# Patient Record
Sex: Female | Born: 1980
Health system: Southern US, Community
[De-identification: ages and names within clinical notes are randomized; demographics above are authoritative.]

## PROBLEM LIST (undated history)

## (undated) DIAGNOSIS — R87629 Unspecified abnormal cytological findings in specimens from vagina: Secondary | ICD-10-CM

## (undated) DIAGNOSIS — E0500 Thyrotoxicosis with diffuse goiter without thyrotoxic crisis or storm: Secondary | ICD-10-CM

## (undated) DIAGNOSIS — F419 Anxiety disorder, unspecified: Secondary | ICD-10-CM

## (undated) DIAGNOSIS — I10 Essential (primary) hypertension: Secondary | ICD-10-CM

## (undated) DIAGNOSIS — F329 Major depressive disorder, single episode, unspecified: Secondary | ICD-10-CM

## (undated) DIAGNOSIS — F32A Depression, unspecified: Secondary | ICD-10-CM

## (undated) DIAGNOSIS — E079 Disorder of thyroid, unspecified: Secondary | ICD-10-CM

## (undated) HISTORY — PX: CERVICAL CONE BIOPSY: SUR198

## (undated) HISTORY — PX: GYNECOLOGIC CRYOSURGERY: SHX857

## (undated) HISTORY — DX: Unspecified abnormal cytological findings in specimens from vagina: R87.629

## (undated) HISTORY — DX: Depression, unspecified: F32.A

## (undated) HISTORY — DX: Major depressive disorder, single episode, unspecified: F32.9

## (undated) HISTORY — DX: Anxiety disorder, unspecified: F41.9

## (undated) HISTORY — PX: TUBAL LIGATION: SHX77

---

## 2011-12-04 ENCOUNTER — Emergency Department (HOSPITAL_COMMUNITY)
Admission: EM | Admit: 2011-12-04 | Discharge: 2011-12-04 | Disposition: A | Discharge status: Home / Self Care | Payer: Self-pay | Attending: Emergency Medicine | Admitting: Emergency Medicine

## 2011-12-04 ENCOUNTER — Encounter (HOSPITAL_COMMUNITY): Payer: Self-pay | Admitting: Emergency Medicine

## 2011-12-04 DIAGNOSIS — E079 Disorder of thyroid, unspecified: Secondary | ICD-10-CM | POA: Insufficient documentation

## 2011-12-04 DIAGNOSIS — J029 Acute pharyngitis, unspecified: Secondary | ICD-10-CM | POA: Insufficient documentation

## 2011-12-04 DIAGNOSIS — F17200 Nicotine dependence, unspecified, uncomplicated: Secondary | ICD-10-CM | POA: Insufficient documentation

## 2011-12-04 DIAGNOSIS — I10 Essential (primary) hypertension: Secondary | ICD-10-CM | POA: Insufficient documentation

## 2011-12-04 HISTORY — DX: Essential (primary) hypertension: I10

## 2011-12-04 HISTORY — DX: Disorder of thyroid, unspecified: E07.9

## 2011-12-04 LAB — RAPID STREP SCREEN (MED CTR MEBANE ONLY): Streptococcus, Group A Screen (Direct): NEGATIVE

## 2011-12-04 MED ORDER — HYDROCODONE-ACETAMINOPHEN 5-325 MG PO TABS: 2.0000 | ORAL_TABLET | ORAL | Status: DC | PRN

## 2011-12-04 MED ORDER — ACETAMINOPHEN 325 MG PO TABS
650.0000 mg | ORAL_TABLET | Freq: Once | ORAL | Status: AC
  Administered 2011-12-04: 650 mg via ORAL
  Filled 2011-12-04: qty 2

## 2011-12-04 NOTE — ED Provider Notes (Signed)
History     CSN: [id number]  Arrival date & time 12/04/11  2725   First MD Initiated Contact with Patient 12/04/11 [phone]      Chief Complaint  Patient presents with  . Sore Throat     HPI Patient presents with sore throat since early this morning.  No known fever.  No exposure to strep that she is aware of.  No cough.  No vomiting.  Did have a tickle in her throat prior to this.  Past history of Graves' disease. Past Medical History  Diagnosis Date  . Thyroid disease   . Hypertension     Past Surgical History  Procedure Date  . Tonsillectomy     Family History  Problem Relation Age of Onset  . Rheum arthritis Mother   . Asthma Mother   . Asthma Father     History  Substance Use Topics  . Smoking status: Current Every Day Smoker -- 5.0 packs/day    Types: Cigarettes  . Smokeless tobacco: Not on file  . Alcohol Use: Yes     occ    OB History    Grav Para Term Preterm Abortions TAB SAB Ect Mult Living                  Review of Systems All other systems reviewed and are negative Allergies  Review of patient's allergies indicates no known allergies.  Home Medications  No current outpatient prescriptions on file.  BP 133/84  Pulse 89  Temp 100.5 F (38.1 C) (Oral)  Resp 18  SpO2 100%  LMP 11/27/2011  Physical Exam  Nursing note and vitals reviewed. Constitutional: She is oriented to person, place, and time. She appears well-developed and well-nourished. No distress.  HENT:  Head: Normocephalic and atraumatic.  Mouth/Throat: Uvula is midline. Posterior oropharyngeal edema present. No oropharyngeal exudate, posterior oropharyngeal erythema or tonsillar abscesses.  Eyes: Pupils are equal, round, and reactive to light.  Neck: Normal range of motion.  Cardiovascular: Normal rate and intact distal pulses.   Pulmonary/Chest: No respiratory distress.  Abdominal: Normal appearance. She exhibits no distension.  Musculoskeletal: Normal range of motion.    Neurological: She is alert and oriented to person, place, and time. No cranial nerve deficit.  Skin: Skin is warm and dry. No rash noted.  Psychiatric: She has a normal mood and affect. Her behavior is normal.    ED Course  Procedures (including critical care time)  Medications  acetaminophen (TYLENOL) tablet 650 mg (650 mg Oral Given 12/04/11 1144)     Labs Reviewed  RAPID STREP SCREEN   No results found.   1. Pharyngitis       MDM         Nelia Shi, MD 12/04/11 1158

## 2011-12-04 NOTE — ED Notes (Signed)
Pt here for c/o of sore throat and difficulty swallowing.

## 2012-06-03 ENCOUNTER — Encounter (HOSPITAL_COMMUNITY): Payer: Self-pay | Admitting: Emergency Medicine

## 2012-06-03 ENCOUNTER — Emergency Department (INDEPENDENT_AMBULATORY_CARE_PROVIDER_SITE_OTHER): Payer: Self-pay

## 2012-06-03 ENCOUNTER — Other Ambulatory Visit (HOSPITAL_COMMUNITY)
Admission: RE | Admit: 2012-06-03 | Discharge: 2012-06-03 | Disposition: A | Discharge status: Home / Self Care | Payer: Self-pay | Source: Ambulatory Visit | Attending: Emergency Medicine | Admitting: Emergency Medicine

## 2012-06-03 ENCOUNTER — Emergency Department (INDEPENDENT_AMBULATORY_CARE_PROVIDER_SITE_OTHER)
Admission: EM | Admit: 2012-06-03 | Discharge: 2012-06-03 | Disposition: A | Discharge status: Home / Self Care | Payer: Self-pay | Source: Home / Self Care | Attending: Family Medicine | Admitting: Family Medicine

## 2012-06-03 DIAGNOSIS — Z113 Encounter for screening for infections with a predominantly sexual mode of transmission: Secondary | ICD-10-CM | POA: Insufficient documentation

## 2012-06-03 DIAGNOSIS — M546 Pain in thoracic spine: Secondary | ICD-10-CM

## 2012-06-03 DIAGNOSIS — N760 Acute vaginitis: Secondary | ICD-10-CM | POA: Insufficient documentation

## 2012-06-03 DIAGNOSIS — E0590 Thyrotoxicosis, unspecified without thyrotoxic crisis or storm: Secondary | ICD-10-CM

## 2012-06-03 DIAGNOSIS — M549 Dorsalgia, unspecified: Secondary | ICD-10-CM

## 2012-06-03 DIAGNOSIS — F319 Bipolar disorder, unspecified: Secondary | ICD-10-CM

## 2012-06-03 DIAGNOSIS — I10 Essential (primary) hypertension: Secondary | ICD-10-CM

## 2012-06-03 HISTORY — DX: Thyrotoxicosis with diffuse goiter without thyrotoxic crisis or storm: E05.00

## 2012-06-03 LAB — POCT URINALYSIS DIP (DEVICE)
Glucose, UA: NEGATIVE mg/dL
Ketones, ur: NEGATIVE mg/dL
Leukocytes, UA: NEGATIVE
Specific Gravity, Urine: 1.015 (ref 1.005–1.030)

## 2012-06-03 LAB — POCT PREGNANCY, URINE: Preg Test, Ur: NEGATIVE

## 2012-06-03 NOTE — ED Notes (Signed)
Pt c/o pelvic pain onset 1 month Pain radiates towards left side and lower left back Will have occasional pain when she sneezes or coughs Sx also include: vag discharge w/a slight odor Denies: f/v/n/d, dysuria, hematuria  She is alert and oriented w/no signs of acute distress.

## 2012-06-03 NOTE — ED Notes (Signed)
Call back number verified.  

## 2012-06-03 NOTE — ED Provider Notes (Signed)
History     CSN: [id number]  Arrival date & time 06/03/12  1805   First MD Initiated Contact with Patient 06/03/12 2014      Chief Complaint  Patient presents with  . Vaginal Discharge    (Consider location/radiation/quality/duration/timing/severity/associated sxs/prior treatment) HPI Comments: Pt with hx bipolar disorder, hypertension, hyperthyroidism all which are currently untreated as pt recently moved here and doesn't have health insurance. Here for vaginal d/c for a month and L middle lateral back pain on and off for 5 months.   Patient is a 32 y.o. female presenting with vaginal discharge and back pain. The history is provided by the patient.  Vaginal Discharge This is a new problem. Episode onset: 1 month ago. The problem occurs every several days. The problem has not changed since onset.Pertinent negatives include no chest pain and no abdominal pain. Nothing aggravates the symptoms. Nothing relieves the symptoms. She has tried nothing for the symptoms.  Back Pain Location:  Thoracic spine Quality:  Aching Radiates to:  Does not radiate Pain severity:  Moderate Onset quality:  Gradual Duration:  5 months Timing:  Sporadic Progression:  Unchanged Chronicity:  Recurrent Context comment:  Pt can't name anything that precedes pain Relieved by: stretching sometimes helps. Worsened by:  Deep breathing Associated symptoms: no abdominal pain, no chest pain, no dysuria, no fever, no numbness and no tingling     Past Medical History  Diagnosis Date  . Thyroid disease   . Hypertension   . Graves disease     Past Surgical History  Procedure Laterality Date  . Tonsillectomy      Family History  Problem Relation Age of Onset  . Rheum arthritis Mother   . Asthma Mother   . Asthma Father     History  Substance Use Topics  . Smoking status: Current Every Day Smoker -- 5.00 packs/day    Types: Cigarettes  . Smokeless tobacco: Not on file  . Alcohol Use: Yes   Comment: occ    OB History   Grav Para Term Preterm Abortions TAB SAB Ect Mult Living                  Review of Systems  Constitutional: Negative for fever and chills.  Cardiovascular: Negative for chest pain.  Gastrointestinal: Negative for abdominal pain.  Genitourinary: Positive for vaginal discharge. Negative for dysuria and flank pain.  Musculoskeletal: Positive for back pain.  Neurological: Negative for tingling and numbness.    Allergies  Review of patient's allergies indicates no known allergies.  Home Medications   Current Outpatient Rx  Name  Route  Sig  Dispense  Refill  . ARIPiprazole (ABILIFY PO)   Oral   Take by mouth.         Marland Kitchen HYDROcodone-acetaminophen (NORCO/VICODIN) 5-325 MG per tablet   Oral   Take 2 tablets by mouth every 4 (four) hours as needed for pain.   10 tablet   0   . Sertraline HCl (ZOLOFT PO)   Oral   Take by mouth.           BP 151/96  Pulse 65  Temp(Src) 98.6 F (37 C) (Oral)  Resp 18  SpO2 98%  LMP 05/11/2012  Physical Exam  Constitutional: She appears well-developed and well-nourished. No distress.  Neck: Thyromegaly present.  Cardiovascular: Normal rate and regular rhythm.   Pulmonary/Chest: Effort normal and breath sounds normal. She exhibits no tenderness.  Abdominal: Normal appearance and bowel sounds are normal. She exhibits  no distension. There is no hepatosplenomegaly. There is tenderness in the right lower quadrant, suprapubic area and left lower quadrant. There is no rigidity, no rebound, no guarding and no CVA tenderness.  Tenderness to palp is mild  Genitourinary: There is no rash, tenderness or lesion on the right labia. There is no rash, tenderness or lesion on the left labia. Uterus is enlarged. Cervix exhibits no motion tenderness, no discharge and no friability. Right adnexum displays no mass and no tenderness. Left adnexum displays no mass and no tenderness. No vaginal discharge found.  Pt cervix shows  changes from colpo last year.   Musculoskeletal:       Thoracic back: Normal.       Lumbar back: Normal.  Lymphadenopathy:       Right: No inguinal adenopathy present.       Left: No inguinal adenopathy present.  Psychiatric: She has a normal mood and affect. Her speech is normal and behavior is normal. Judgment and thought content normal. Cognition and memory are normal.    ED Course  Procedures (including critical care time)  Labs Reviewed  POCT URINALYSIS DIP (DEVICE)  POCT PREGNANCY, URINE  CERVICOVAGINAL ANCILLARY ONLY   Dg Chest 2 View  06/03/2012  *RADIOLOGY REPORT*  Clinical Data: Left-sided chest pain  CHEST - 2 VIEW  Comparison: None.  Findings: Heart size is normal.  Mediastinal shadows are normal. The lungs are clear.  Vascularity is normal.  No effusions.  No significant bony finding.  IMPRESSION: Normal chest   Original Report Authenticated By: Paulina Fusi, M.D.      1. Mid back pain on left side   2. Hyperthyroidism   3. Hypertension   4. Bipolar disorder       MDM  As pt's back hurts with deep breathing, she is a smoker and pain was not reproducible with palpation, cxr performed which was negative. No vaginal discharge noted on exam, no treatment offered, will wait for results of testing.  Pt's thyroid enlarged, bp elevated- stressed to pt importance getting pcp. Given numbers for adult care clinic and [facility], as well as number for behavioral health.    06/05/12: pt's wet prep positive for gardnerella. E-script for flagyl 500mg  BID #14 sent to pharmacy on file.      Cathlyn Parsons, NP 06/03/12 2024  Cathlyn Parsons, NP 06/05/12 [phone]

## 2012-06-05 ENCOUNTER — Telehealth (HOSPITAL_COMMUNITY): Payer: Self-pay | Admitting: *Deleted

## 2012-06-05 MED ORDER — METRONIDAZOLE 500 MG PO TABS: 500.0000 mg | ORAL_TABLET | Freq: Two times a day (BID) | ORAL | Status: DC

## 2012-06-05 NOTE — ED Notes (Signed)
Gc/Chlamydia neg., Affirm: Candida and Trich neg., Gardnerella pos.  Rica Mast e-prescribed Flagyl to the [facility] at [facility]. I called and left a message to call. Vassie Moselle 06/05/2012

## 2012-06-06 ENCOUNTER — Telehealth (HOSPITAL_COMMUNITY): Payer: Self-pay | Admitting: *Deleted

## 2012-06-06 NOTE — ED Notes (Signed)
I called pt. Pt. verified x 2 and given results.  Pt. told she needs Flagyl for bacterial vaginosis.   Pt. instructed to no alcohol while taking this medication.  Pt. told the Rx. was sent to  [facility].  Pt.'s questions about bacterial vaginosis answered. Pt. voiced understanding. Vassie Moselle 06/06/2012

## 2012-06-11 NOTE — ED Provider Notes (Signed)
Medical screening examination/treatment/procedure(s) were performed by resident physician or non-physician practitioner and as supervising physician I was immediately available for consultation/collaboration.   Barkley Bruns MD.   Linna Hoff, MD 06/11/12 1224

## 2013-01-14 ENCOUNTER — Ambulatory Visit: Payer: Self-pay | Attending: Internal Medicine | Admitting: Internal Medicine

## 2013-01-14 ENCOUNTER — Ambulatory Visit (HOSPITAL_COMMUNITY)
Admission: RE | Admit: 2013-01-14 | Discharge: 2013-01-14 | Disposition: A | Discharge status: Home / Self Care | Payer: No Typology Code available for payment source | Source: Ambulatory Visit | Attending: Internal Medicine | Admitting: Internal Medicine

## 2013-01-14 VITALS — BP 114/77 | HR 88 | Temp 99.1°F | Resp 16 | Ht 63.0 in | Wt 152.0 lb

## 2013-01-14 DIAGNOSIS — IMO0002 Reserved for concepts with insufficient information to code with codable children: Secondary | ICD-10-CM | POA: Insufficient documentation

## 2013-01-14 DIAGNOSIS — M25569 Pain in unspecified knee: Secondary | ICD-10-CM | POA: Insufficient documentation

## 2013-01-14 DIAGNOSIS — M25561 Pain in right knee: Secondary | ICD-10-CM | POA: Insufficient documentation

## 2013-01-14 DIAGNOSIS — M1710 Unilateral primary osteoarthritis, unspecified knee: Secondary | ICD-10-CM | POA: Insufficient documentation

## 2013-01-14 MED ORDER — ACETAMINOPHEN-CODEINE #3 300-30 MG PO TABS: 1.0000 | ORAL_TABLET | ORAL | Status: DC | PRN

## 2013-01-14 NOTE — Addendum Note (Signed)
Addended by: Alison Murray on: 01/14/2013 03:53 PM   Modules accepted: Orders

## 2013-01-14 NOTE — Progress Notes (Addendum)
Patient ID: Yolanda Estrada, female   DOB: 07-13-1980, 32 y.o.   MRN: [id number]  CC: Knee pain  HPI: 32 year old female with past medical history of Graves' disease and bipolar disorder but is currently not on any medications presented to clinic for evaluation of bilateral knee pain, on and off for last 2 years she has been having knee pain ever since she fell on her knees. She never had evaluation at that time. There is no swelling or erythema at the joints. She is able to ambulate on her own. Pain is dull and usually 5/10 in intensity and does not go away with Aleve.  No Known Allergies Past Medical History  Diagnosis Date  . Thyroid disease   . Hypertension   . Graves disease    Current Outpatient Prescriptions on File Prior to Visit  Medication Sig Dispense Refill  . ARIPiprazole (ABILIFY PO) Take by mouth.      Marland Kitchen HYDROcodone-acetaminophen (NORCO/VICODIN) 5-325 MG per tablet Take 2 tablets by mouth every 4 (four) hours as needed for pain.  10 tablet  0  . metroNIDAZOLE (FLAGYL) 500 MG tablet Take 1 tablet (500 mg total) by mouth 2 (two) times daily.  14 tablet  0  . Sertraline HCl (ZOLOFT PO) Take by mouth.       No current facility-administered medications on file prior to visit.   Family History  Problem Relation Age of Onset  . Rheum arthritis Mother   . Asthma Mother   . Asthma Father    History   Social History  . Marital Status: Single    Spouse Name: N/A    Number of Children: N/A  . Years of Education: N/A   Occupational History  . Not on file.   Social History Main Topics  . Smoking status: Current Every Day Smoker -- 5.00 packs/day    Types: Cigarettes  . Smokeless tobacco: Not on file  . Alcohol Use: Yes     Comment: occ  . Drug Use: No  . Sexual Activity: Yes   Other Topics Concern  . Not on file   Social History Narrative  . No narrative on file    Review of Systems  Constitutional: Negative for fever, chills, diaphoresis, activity change,  appetite change and fatigue.  HENT: Negative for ear pain, nosebleeds, congestion, facial swelling, rhinorrhea, neck pain, neck stiffness and ear discharge.   Eyes: Negative for pain, discharge, redness, itching and visual disturbance.  Respiratory: Negative for cough, choking, chest tightness, shortness of breath, wheezing and stridor.   Cardiovascular: Negative for chest pain, palpitations and leg swelling.  Gastrointestinal: Negative for abdominal distention.  Genitourinary: Negative for dysuria, urgency, frequency, hematuria, flank pain, decreased urine volume, difficulty urinating and dyspareunia.  Musculoskeletal: Negative for back pain, positive for bilateral knee pain  Neurological: Negative for dizziness, tremors, seizures, syncope, facial asymmetry, speech difficulty, weakness, light-headedness, numbness and headaches.  Hematological: Negative for adenopathy. Does not bruise/bleed easily.  Psychiatric/Behavioral: Negative for hallucinations, behavioral problems, confusion, dysphoric mood, decreased concentration and agitation.    Objective:   Filed Vitals:   01/14/13 1523  BP: 114/77  Pulse: 88  Temp: 99.1 F (37.3 C)  Resp: 16    Physical Exam  Constitutional: Appears well-developed and well-nourished. No distress.  HENT: Normocephalic. External right and left ear normal. Oropharynx is clear and moist.  Eyes: Conjunctivae and EOM are normal. PERRLA, no scleral icterus.  Neck: Normal ROM. Neck supple. No JVD. No tracheal deviation. No thyromegaly.  CVS: RRR,  S1/S2 +, no murmurs, no gallops, no carotid bruit.  Pulmonary: Effort and breath sounds normal, no stridor, rhonchi, wheezes, rales.  Abdominal: Soft. BS +,  no distension, tenderness, rebound or guarding.  Musculoskeletal: Normal range of motion. No edema and no tenderness.  Lymphadenopathy: No lymphadenopathy noted, cervical, inguinal. Neuro: Alert. Normal reflexes, muscle tone coordination. No cranial nerve  deficit. Skin: Skin is warm and dry. No rash noted. Not diaphoretic. No erythema. No pallor.  Psychiatric: Normal mood and affect. Behavior, judgment, thought content normal.   No results found for this basename: WBC, HGB, HCT, MCV, PLT   No results found for this basename: CREATININE, BUN, NA, K, CL, CO2    No results found for this basename: HGBA1C   Lipid Panel  No results found for this basename: chol, trig, hdl, cholhdl, vldl, ldlcalc       Assessment and plan:   Patient Active Problem List   Diagnosis Date Noted  . Bilateral knee pain 01/14/2013    Priority: High - obtain x rays of the both knees     H/O Graves disease - not on any meds - will check TSH today

## 2013-01-14 NOTE — Progress Notes (Signed)
Pt is here to establish care. Pt reports having pain in her joint and mostly in her knees.

## 2013-01-14 NOTE — Patient Instructions (Signed)
Knee Pain Knee pain can be a result of an injury or other medical conditions. Treatment will depend on the cause of your pain. HOME CARE  Only take medicine as told by your doctor.  Keep a healthy weight. Being overweight can make the knee hurt more.  Stretch before exercising or playing sports.  If there is constant knee pain, change the way you exercise. Ask your doctor for advice.  Make sure shoes fit well. Choose the right shoe for the sport or activity.  Protect your knees. Wear kneepads if needed.  Rest when you are tired. GET HELP RIGHT AWAY IF:   Your knee pain does not stop.  Your knee pain does not get better.  Your knee joint feels hot to the touch.  You have a fever. MAKE SURE YOU:   Understand these instructions.  Will watch this condition.  Will get help right away if you are not doing well or get worse. Document Released: 04/21/2008 Document Revised: 04/17/2011 Document Reviewed: 04/21/2008 [employer] Patient Information 2014 [employer].  

## 2013-01-15 LAB — TSH: TSH: 0.486 u[IU]/mL (ref 0.350–4.500)

## 2013-01-16 ENCOUNTER — Telehealth: Payer: Self-pay | Admitting: Emergency Medicine

## 2013-01-16 ENCOUNTER — Telehealth: Payer: Self-pay | Admitting: Internal Medicine

## 2013-01-16 DIAGNOSIS — E0500 Thyrotoxicosis with diffuse goiter without thyrotoxic crisis or storm: Secondary | ICD-10-CM

## 2013-01-16 NOTE — Telephone Encounter (Signed)
Pt calling about lab and x-ray results.  Please f/u with pt.

## 2013-01-16 NOTE — Telephone Encounter (Signed)
Pt requesting lab imaging results/labs

## 2013-02-11 ENCOUNTER — Ambulatory Visit: Payer: Self-pay

## 2013-02-21 NOTE — Telephone Encounter (Signed)
Please inform patient that her knee x-rays shows osteoarthritis, no fracture or dislocation seen

## 2013-02-21 NOTE — Telephone Encounter (Signed)
Please inform patient that her thyroid function is normal.

## 2013-02-24 NOTE — Addendum Note (Signed)
Addended by: Nonnie DoneSMITH, Camron Essman D on: 02/24/2013 05:52 PM   Modules accepted: Orders

## 2013-02-24 NOTE — Telephone Encounter (Signed)
Spoke with pt regarding knee pain and lab results. Pt requesting referral for knee pain and endocrinology for graves disease. Referral placed. Pt aware

## 2013-03-02 ENCOUNTER — Emergency Department (HOSPITAL_COMMUNITY): Payer: No Typology Code available for payment source

## 2013-03-02 ENCOUNTER — Encounter (HOSPITAL_COMMUNITY): Payer: Self-pay | Admitting: Emergency Medicine

## 2013-03-02 ENCOUNTER — Emergency Department (HOSPITAL_COMMUNITY)
Admission: EM | Admit: 2013-03-02 | Discharge: 2013-03-02 | Disposition: A | Discharge status: Home / Self Care | Payer: No Typology Code available for payment source | Attending: Emergency Medicine | Admitting: Emergency Medicine

## 2013-03-02 DIAGNOSIS — F411 Generalized anxiety disorder: Secondary | ICD-10-CM | POA: Insufficient documentation

## 2013-03-02 DIAGNOSIS — R002 Palpitations: Secondary | ICD-10-CM | POA: Insufficient documentation

## 2013-03-02 DIAGNOSIS — E079 Disorder of thyroid, unspecified: Secondary | ICD-10-CM | POA: Insufficient documentation

## 2013-03-02 DIAGNOSIS — F419 Anxiety disorder, unspecified: Secondary | ICD-10-CM

## 2013-03-02 DIAGNOSIS — R42 Dizziness and giddiness: Secondary | ICD-10-CM

## 2013-03-02 DIAGNOSIS — E0500 Thyrotoxicosis with diffuse goiter without thyrotoxic crisis or storm: Secondary | ICD-10-CM | POA: Insufficient documentation

## 2013-03-02 DIAGNOSIS — I69998 Other sequelae following unspecified cerebrovascular disease: Secondary | ICD-10-CM | POA: Insufficient documentation

## 2013-03-02 DIAGNOSIS — R51 Headache: Secondary | ICD-10-CM | POA: Insufficient documentation

## 2013-03-02 DIAGNOSIS — F17200 Nicotine dependence, unspecified, uncomplicated: Secondary | ICD-10-CM | POA: Insufficient documentation

## 2013-03-02 DIAGNOSIS — F1220 Cannabis dependence, uncomplicated: Secondary | ICD-10-CM | POA: Insufficient documentation

## 2013-03-02 LAB — URINALYSIS, ROUTINE W REFLEX MICROSCOPIC
Bilirubin Urine: NEGATIVE
Glucose, UA: NEGATIVE mg/dL
HGB URINE DIPSTICK: NEGATIVE
Ketones, ur: NEGATIVE mg/dL
LEUKOCYTES UA: NEGATIVE
NITRITE: NEGATIVE
PROTEIN: NEGATIVE mg/dL
SPECIFIC GRAVITY, URINE: 1.021 (ref 1.005–1.030)
UROBILINOGEN UA: 0.2 mg/dL (ref 0.0–1.0)
pH: 7 (ref 5.0–8.0)

## 2013-03-02 LAB — CBC WITH DIFFERENTIAL/PLATELET
BASOS ABS: 0 10*3/uL (ref 0.0–0.1)
Basophils Relative: 0 % (ref 0–1)
EOS ABS: 0.1 10*3/uL (ref 0.0–0.7)
EOS PCT: 1 % (ref 0–5)
HEMATOCRIT: 39.5 % (ref 36.0–46.0)
Hemoglobin: 13.3 g/dL (ref 12.0–15.0)
LYMPHS PCT: 51 % — AB (ref 12–46)
Lymphs Abs: 3.6 10*3/uL (ref 0.7–4.0)
MCH: 27.7 pg (ref 26.0–34.0)
MCHC: 33.7 g/dL (ref 30.0–36.0)
MCV: 82.1 fL (ref 78.0–100.0)
MONO ABS: 0.3 10*3/uL (ref 0.1–1.0)
Monocytes Relative: 5 % (ref 3–12)
Neutro Abs: 3.1 10*3/uL (ref 1.7–7.7)
Neutrophils Relative %: 43 % (ref 43–77)
Platelets: 251 10*3/uL (ref 150–400)
RBC: 4.81 MIL/uL (ref 3.87–5.11)
RDW: 13.6 % (ref 11.5–15.5)
WBC: 7.1 10*3/uL (ref 4.0–10.5)

## 2013-03-02 LAB — POCT I-STAT, CHEM 8
BUN: 8 mg/dL (ref 6–23)
CALCIUM ION: 1.24 mmol/L — AB (ref 1.12–1.23)
Chloride: 103 mEq/L (ref 96–112)
Creatinine, Ser: 1 mg/dL (ref 0.50–1.10)
Glucose, Bld: 81 mg/dL (ref 70–99)
HCT: 44 % (ref 36.0–46.0)
HEMOGLOBIN: 15 g/dL (ref 12.0–15.0)
Potassium: 4.1 mEq/L (ref 3.7–5.3)
SODIUM: 140 meq/L (ref 137–147)
TCO2: 25 mmol/L (ref 0–100)

## 2013-03-02 LAB — POCT PREGNANCY, URINE: PREG TEST UR: NEGATIVE

## 2013-03-02 MED ORDER — LORAZEPAM 1 MG PO TABS: 1.0000 mg | ORAL_TABLET | Freq: Three times a day (TID) | ORAL | Status: DC | PRN

## 2013-03-02 MED ORDER — MECLIZINE HCL 25 MG PO TABS
25.0000 mg | ORAL_TABLET | Freq: Once | ORAL | Status: AC
  Administered 2013-03-02: 25 mg via ORAL
  Filled 2013-03-02: qty 1

## 2013-03-02 NOTE — ED Notes (Signed)
Pt here with c/o anxiety , pt states that her pulses was around 113 at school , pt HR is 84 today , pt in in school and working 2 jobs

## 2013-03-02 NOTE — ED Notes (Signed)
Pt ambulated to restroom. 

## 2013-03-02 NOTE — ED Notes (Signed)
Patient transported to CT 

## 2013-03-02 NOTE — ED Provider Notes (Addendum)
CSN: [id number]     Arrival date & time 03/02/13  1249 History   First MD Initiated Contact with Patient 03/02/13 1339     Chief Complaint  Patient presents with  . Anxiety   (Consider location/radiation/quality/duration/timing/severity/associated sxs/prior Treatment) Patient is a 33 y.o. female presenting with dizziness. The history is provided by the patient.  Dizziness Quality:  Imbalance and room spinning Severity:  Moderate Onset quality:  Gradual Timing:  Intermittent Progression:  Waxing and waning Chronicity:  New Context: bending over, head movement and standing up   Context: not with loss of consciousness   Relieved by:  Closing eyes and lying down Worsened by:  Movement, turning head and standing up (walking) Associated symptoms: headaches and palpitations   Associated symptoms: no nausea, no shortness of breath, no vision changes, no vomiting and no weakness   Risk factors: no anemia   Risk factors comment:  Hx of graves disease but recent check by PCP and normal thyroid   Past Medical History  Diagnosis Date  . Thyroid disease   . Hypertension   . Graves disease    Past Surgical History  Procedure Laterality Date  . Tubal ligation     Family History  Problem Relation Age of Onset  . Rheum arthritis Mother   . Asthma Mother   . Asthma Father    History  Substance Use Topics  . Smoking status: Current Every Day Smoker -- 5.00 packs/day    Types: Cigarettes  . Smokeless tobacco: Not on file  . Alcohol Use: Yes     Comment: occ   OB History   Grav Para Term Preterm Abortions TAB SAB Ect Mult Living                 Review of Systems  Respiratory: Negative for shortness of breath.   Cardiovascular: Positive for palpitations.  Gastrointestinal: Negative for nausea and vomiting.  Neurological: Positive for dizziness and headaches.  Psychiatric/Behavioral: Negative for suicidal ideas, confusion, sleep disturbance and self-injury. The patient is not  nervous/anxious.   All other systems reviewed and are negative.    Allergies  Review of patient's allergies indicates no known allergies.  Home Medications   Current Outpatient Rx  Name  Route  Sig  Dispense  Refill  . naproxen sodium (ANAPROX) 220 MG tablet   Oral   Take 220 mg by mouth as needed (for osteoarthritis).          BP 122/78  Pulse 88  Temp(Src) 98.9 F (37.2 C) (Oral)  Resp 20  Ht 5\' 2"  (1.575 m)  Wt 148 lb (67.132 kg)  BMI 27.06 kg/m2  SpO2 100% Physical Exam  Nursing note and vitals reviewed. Constitutional: She is oriented to person, place, and time. She appears well-developed and well-nourished. No distress.  HENT:  Head: Normocephalic and atraumatic.  Mouth/Throat: Oropharynx is clear and moist.  Eyes: Conjunctivae and EOM are normal. Pupils are equal, round, and reactive to light.  No nystagmus  Neck: Normal range of motion. Neck supple.  Cardiovascular: Normal rate, regular rhythm and intact distal pulses.   No murmur heard. Pulmonary/Chest: Effort normal and breath sounds normal. No respiratory distress. She has no wheezes. She has no rales.  Abdominal: Soft. She exhibits no distension. There is no tenderness. There is no rebound and no guarding.  Musculoskeletal: Normal range of motion. She exhibits no edema and no tenderness.  Neurological: She is alert and oriented to person, place, and time. She has normal strength.  No cranial nerve deficit or sensory deficit.  Skin: Skin is warm and dry. No rash noted. No erythema.  Psychiatric: She has a normal mood and affect. Her behavior is normal.    ED Course  Procedures (including critical care time) Labs Review Labs Reviewed  CBC WITH DIFFERENTIAL - Abnormal; Notable for the following:    Lymphocytes Relative 51 (*)    All other components within normal limits  POCT I-STAT, CHEM 8 - Abnormal; Notable for the following:    Calcium, Ion 1.24 (*)    All other components within normal limits   URINALYSIS, ROUTINE W REFLEX MICROSCOPIC  POCT PREGNANCY, URINE   Imaging Review Dg Chest 2 View  03/02/2013   CLINICAL DATA:  Palpitation  EXAM: CHEST  2 VIEW  COMPARISON:  06/03/2012  FINDINGS: Normal heart size. Clear lungs. No pleural effusion. No pneumothorax.  IMPRESSION: No active cardiopulmonary disease.   Electronically Signed   By: Maryclare Bean M.D.   On: 03/02/2013 15:26   Ct Head Wo Contrast  03/02/2013   CLINICAL DATA:  Vertigo.  EXAM: CT HEAD WITHOUT CONTRAST  TECHNIQUE: Contiguous axial images were obtained from the base of the skull through the vertex without intravenous contrast.  COMPARISON:  None.  FINDINGS: No evidence of intracranial hemorrhage, brain edema, or other signs of acute infarction. No evidence of intracranial mass lesion or mass effect. No abnormal extraaxial fluid collections identified. Ventricles are normal in size. No skull abnormality identified.  IMPRESSION: Negative noncontrast head CT.   Electronically Signed   By: Myles Rosenthal M.D.   On: 03/02/2013 15:03    EKG Interpretation   None      Date: 03/02/2013  Rate: 84  Rhythm: normal sinus rhythm  QRS Axis: normal  Intervals: normal  ST/T Wave abnormalities: normal  Conduction Disutrbances: none  Narrative Interpretation: unremarkable      MDM   1. Anxiety   2. Vertigo     Patient with a prior history of psychiatric disorder and anxiety which she has been able to cope with for the last several years and feels like she's done a good job without medication who presents today with symptoms more suggestive of syncope with an off-balance type feeling and spinning sensation when she changes position or moves her head. Also she gets intermittent palpitations with this. Patient does have a history of Graves' disease but states her TSH was recently checked and was within normal limits. Patient states her periods have been irregular but is getting off of her menses currently. She denies any abdominal pain  he takes no medications. She does use marijuana and smokes cigarettes but denies heavy alcohol use. She denies feeling anxious or suicidal at this time. She is well appearing on exam with normal blood pressure pulse and oxygen saturation and does not appear anxious. Feel most likely symptoms are related to vertigo so we'll treat with meclizine. However will ensure normal hemoglobin levels and that patient is not pregnant.  Pt is PERC neg.  4:05 PM All labs wnl.  Imaging neg.  No changed with meclizine and feel this is most likely pt's anxiety.  Will treat with ativan prn and given information to f/u with [facility].  Gwyneth Sprout, MD 03/02/13 1621  Gwyneth Sprout, MD 03/02/13 1623

## 2013-03-02 NOTE — ED Notes (Signed)
Pt returned from radiology.

## 2013-03-02 NOTE — Discharge Instructions (Signed)
Dizziness ° Dizziness means you feel unsteady or lightheaded. You might feel like you are going to pass out (faint). °HOME CARE  °· Drink enough fluids to keep your pee (urine) clear or pale yellow. °· Take your medicines exactly as told by your doctor. If you take blood pressure medicine, always stand up slowly from the lying or sitting position. Hold on to something to steady yourself. °· If you need to stand in one place for a long time, move your legs often. Tighten and relax your leg muscles. °· Have someone stay with you until you feel okay. °· Do not drive or use heavy machinery if you feel dizzy. °· Do not drink alcohol. °GET HELP RIGHT AWAY IF:  °· You feel dizzy or lightheaded and it gets worse. °· You feel sick to your stomach (nauseous), or you throw up (vomit). °· You have trouble talking or walking. °· You feel weak or have trouble using your arms, hands, or legs. °· You cannot think clearly or have trouble forming sentences. °· You have chest pain, belly (abdominal) pain, sweating, or you are short of breath. °· Your vision changes. °· You are bleeding. °· You have problems from your medicine that seem to be getting worse. °MAKE SURE YOU:  °· Understand these instructions. °· Will watch your condition. °· Will get help right away if you are not doing well or get worse. °Document Released: 01/12/2011 Document Revised: 04/17/2011 Document Reviewed: 01/12/2011 °[employer]® Patient Information ©2014 [employer]. ° °

## 2013-03-03 ENCOUNTER — Ambulatory Visit: Payer: Self-pay | Admitting: Endocrinology

## 2013-03-03 ENCOUNTER — Ambulatory Visit: Payer: No Typology Code available for payment source | Admitting: Internal Medicine

## 2013-03-12 ENCOUNTER — Ambulatory Visit: Payer: Self-pay | Admitting: Family Medicine

## 2013-03-31 ENCOUNTER — Encounter: Payer: Self-pay | Admitting: Sports Medicine

## 2013-03-31 ENCOUNTER — Ambulatory Visit (INDEPENDENT_AMBULATORY_CARE_PROVIDER_SITE_OTHER): Payer: No Typology Code available for payment source | Admitting: Sports Medicine

## 2013-03-31 VITALS — BP 115/73 | HR 78 | Ht 62.0 in | Wt 148.0 lb

## 2013-03-31 DIAGNOSIS — M25561 Pain in right knee: Secondary | ICD-10-CM

## 2013-03-31 DIAGNOSIS — M25562 Pain in left knee: Principal | ICD-10-CM

## 2013-03-31 DIAGNOSIS — M25569 Pain in unspecified knee: Secondary | ICD-10-CM

## 2013-03-31 MED ORDER — MELOXICAM 15 MG PO TABS: 15.0000 mg | ORAL_TABLET | Freq: Every day | ORAL | Status: DC

## 2013-03-31 NOTE — Patient Instructions (Signed)
Thank you for coming today. 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  Please start formal physical therapy to strengthen you quads Please start the meloxicam to relieve some of your pain and decrease inflammation Do not take aleve during this time. Please come back in 4 weeks

## 2013-03-31 NOTE — Assessment & Plan Note (Signed)
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

## 2013-03-31 NOTE — Progress Notes (Signed)
Yolanda Estrada is a 33 y.o. female who [facility] today for bilat knee pain   Knee pain: fell 67mo ago and injured knees Bilat (hit kneecaps directly). Pain is burning. Pain is in knees w/o radiation. When standing feels like knees are unstable. L>R. Changed gait due to work. Denies swelling but feels like fluid in knees. Pain is constant. Worse w/ standing and at night. Keeps up at night. Wakes up from sleep. 7 Aleve per day and 4-5 tylenol per day from time to time w/o benefit.  Ran track in HS and jobs have always required her to be on her feet. Pizza delivery for a living. Associated w/ knees popping and occasionally locking.   PMH reviewed.  ROS as above otherwise neg Medications reviewed.  Exam:  BP 115/73  Pulse 78  Ht 5\' 2"  (1.575 m)  Wt 148 lb (67.132 kg)  BMI 27.06 kg/m2  LMP 02/27/2013 Gen: Well NAD MSK: FROM bilat. L knee medial/lateral joint space ttp. No effusion. Crepitus on L. Strength w/ flexion and extension 5/5. Pattella rocking painful bilat. Apprehension + bilat. Valgus and varrus stresses w/o pain. Lachman's Neg.   PT A LITTLE HISTRIONIC DURING EXAM  Dg Chest 2 View  03/02/2013   CLINICAL DATA:  Palpitation  EXAM: CHEST  2 VIEW  COMPARISON:  06/03/2012  FINDINGS: Normal heart size. Clear lungs. No pleural effusion. No pneumothorax.  IMPRESSION: No active cardiopulmonary disease.   Electronically Signed   By: Maryclare BeanArt  Hoss M.D.   On: 03/02/2013 15:26   Ct Head Wo Contrast  03/02/2013   CLINICAL DATA:  Vertigo.  EXAM: CT HEAD WITHOUT CONTRAST  TECHNIQUE: Contiguous axial images were obtained from the base of the skull through the vertex without intravenous contrast.  COMPARISON:  None.  FINDINGS: No evidence of intracranial hemorrhage, brain edema, or other signs of acute infarction. No evidence of intracranial mass lesion or mass effect. No abnormal extraaxial fluid collections identified. Ventricles are normal in size. No skull abnormality identified.  IMPRESSION:  Negative noncontrast head CT.   Electronically Signed   By: Myles RosenthalJohn  Stahl M.D.   On: 03/02/2013 15:03   Xrays reviewed and w/o overt arthritis.   Assessment and Plan: 1) Bilat knee pain likely from patella femoral syndrome w/ patella instability likely from quad atrophy - Formal PT - Meloxicam - return in 4 wks and consider injections if no improvement  Shelly Flattenavid Eudora Guevarra, MD Family Medicine PGY-3 03/31/2013, 10:00 AM

## 2013-04-07 ENCOUNTER — Ambulatory Visit: Payer: No Typology Code available for payment source

## 2013-04-14 ENCOUNTER — Ambulatory Visit: Payer: No Typology Code available for payment source | Admitting: Physical Therapy

## 2013-08-21 ENCOUNTER — Ambulatory Visit: Payer: No Typology Code available for payment source

## 2014-01-14 ENCOUNTER — Encounter (HOSPITAL_COMMUNITY): Payer: Self-pay | Admitting: Emergency Medicine

## 2014-01-14 ENCOUNTER — Emergency Department (HOSPITAL_COMMUNITY)
Admission: EM | Admit: 2014-01-14 | Discharge: 2014-01-14 | Disposition: A | Discharge status: Home / Self Care | Payer: Medicaid Other | Source: Home / Self Care | Attending: Emergency Medicine | Admitting: Emergency Medicine

## 2014-01-14 DIAGNOSIS — M545 Low back pain, unspecified: Secondary | ICD-10-CM

## 2014-01-14 LAB — POCT URINALYSIS DIP (DEVICE)
BILIRUBIN URINE: NEGATIVE
GLUCOSE, UA: NEGATIVE mg/dL
Ketones, ur: NEGATIVE mg/dL
LEUKOCYTES UA: NEGATIVE
NITRITE: NEGATIVE
PH: 6 (ref 5.0–8.0)
Protein, ur: NEGATIVE mg/dL
Specific Gravity, Urine: >=1.03 (ref 1.005–1.030)
UROBILINOGEN UA: 0.2 mg/dL (ref 0.0–1.0)

## 2014-01-14 LAB — POCT PREGNANCY, URINE: Preg Test, Ur: NEGATIVE

## 2014-01-14 MED ORDER — KETOROLAC TROMETHAMINE 60 MG/2ML IM SOLN
INTRAMUSCULAR | Status: AC
Start: 2014-01-14 — End: 2014-01-14
  Filled 2014-01-14: qty 2

## 2014-01-14 MED ORDER — MELOXICAM 15 MG PO TABS: 15.0000 mg | ORAL_TABLET | Freq: Every day | ORAL | Status: DC

## 2014-01-14 MED ORDER — METHYLPREDNISOLONE ACETATE 80 MG/ML IJ SUSP
80.0000 mg | Freq: Once | INTRAMUSCULAR | Status: AC
  Administered 2014-01-14: 80 mg via INTRAMUSCULAR

## 2014-01-14 MED ORDER — PREDNISONE 20 MG PO TABS: ORAL_TABLET | ORAL | Status: DC

## 2014-01-14 MED ORDER — KETOROLAC TROMETHAMINE 60 MG/2ML IM SOLN
60.0000 mg | Freq: Once | INTRAMUSCULAR | Status: AC
  Administered 2014-01-14: 60 mg via INTRAMUSCULAR

## 2014-01-14 MED ORDER — HYDROCODONE-ACETAMINOPHEN 5-325 MG PO TABS: ORAL_TABLET | ORAL | Status: DC

## 2014-01-14 MED ORDER — METHOCARBAMOL 500 MG PO TABS: 500.0000 mg | ORAL_TABLET | Freq: Three times a day (TID) | ORAL | Status: DC

## 2014-01-14 MED ORDER — METHYLPREDNISOLONE ACETATE 80 MG/ML IJ SUSP
INTRAMUSCULAR | Status: AC
  Filled 2014-01-14: qty 1

## 2014-01-14 NOTE — ED Provider Notes (Signed)
Chief Complaint   Back Pain   History of Present Illness   Yolanda Estrada is a 33 year old female, mother of 3 teenage and preteen age girls, who has had a three-month history of aching in her lower back has been intermittent. This been worse the past 2 weeks. She denies any injury. The pain is confined to the back and does not radiate down into the legs. There is no numbness, tingling, or weakness in the legs. No bladder or bowel dysfunction or saddle anesthesia. She denies any abdominal pain. She's had no fever, chills, headache, weight loss. The pain is worse with bending, lifting, and twisting.  Review of Systems   Other than as noted above, the patient denies any of the following symptoms: Systemic:  No fever, chills, or unexplained weight loss. GI:  No abdominal pain or incontinence of bowel. GU:  No dysuria, frequency, urgency, or hematuria. No incontinence of urine or urinary retention.  M-S:  No neck pain or arthritis. Neuro:  No paresthesias, headache, saddle anesthesia, muscular weakness, or progressive neurological deficit.  PMFSH   Past medical history, family history, social history, meds, and allergies were reviewed. Specifically, there is no history of cancer, major trauma, osteoporosis, immunosuppression, or HIV infection. She has Graves' disease and a history of bipolar disorder.  Physical Examination    Vital signs:  BP 119/76 mmHg  Pulse 66  Temp(Src) 97.6 F (36.4 C) (Oral)  Resp 16  SpO2 100%  LMP 01/14/2014 General:  Alert, oriented, in no distress. Abdomen:  Soft, non-tender.  No organomegaly or mass.  No pulsatile midline abdominal mass or bruit. Back:  There is mild tenderness to palpation up and down the entire lumbar spine both in the paravertebral areas at the midline. Her back has 30 of forward flexion, 10 of backward extension, 20 of lateral bending, and 45 of rotation with pain. Straight leg raising produces pain in the lower back but no pain  radiating down the legs. Neuro:  Normal muscle strength, sensations and DTRs. Extremities: Pedal pulses were full, there was no edema. Skin:  Clear, warm and dry.  No rash.  Labs   Results for orders placed or performed during the hospital encounter of 01/14/14  POCT urinalysis dip (device)  Result Value Ref Range   Glucose, UA NEGATIVE NEGATIVE mg/dL   Bilirubin Urine NEGATIVE NEGATIVE   Ketones, ur NEGATIVE NEGATIVE mg/dL   Specific Gravity, Urine >=1.030 1.005 - 1.030   Hgb urine dipstick MODERATE (A) NEGATIVE   pH 6.0 5.0 - 8.0   Protein, ur NEGATIVE NEGATIVE mg/dL   Urobilinogen, UA 0.2 0.0 - 1.0 mg/dL   Nitrite NEGATIVE NEGATIVE   Leukocytes, UA NEGATIVE NEGATIVE  Pregnancy, urine POC  Result Value Ref Range   Preg Test, Ur NEGATIVE NEGATIVE    Course in Urgent Care Center   The following medications were given:  Medications  ketorolac (TORADOL) injection 60 mg (60 mg Intramuscular Given 01/14/14 1916)  methylPREDNISolone acetate (DEPO-MEDROL) injection 80 mg (80 mg Intramuscular Given 01/14/14 1916)   Assessment   The encounter diagnosis was Bilateral low back pain without sciatica.  No evidence of cauda equina syndrome, discitis, epidural abscess, fracture, acute pyelonephritis, bleed, cancer, or aneurism.    Plan     1.  Meds:  The following meds were prescribed:   Discharge Medication List as of 01/14/2014  6:58 PM    START taking these medications   Details  HYDROcodone-acetaminophen (NORCO/VICODIN) 5-325 MG per tablet 1 to 2 tabs  every 4 to 6 hours as needed for pain., Print    methocarbamol (ROBAXIN) 500 MG tablet Take 1 tablet (500 mg total) by mouth 3 (three) times daily., Starting 01/14/2014, Until Discontinued, Normal    predniSONE (DELTASONE) 20 MG tablet Take 3 daily for 5 days, 2 daily for 5 days, 1 daily for 5 days., Normal        2.  Patient Education/Counseling:  The patient was given appropriate handouts, self care instructions, and instructed  in symptomatic relief. The patient was encouraged to try to be as active as possible and given some exercises to do followed by moist heat.  3.  Follow up:  The patient was told to follow up here if no better in 3 to 4 days, or sooner if becoming worse in any way, and given some red flag symptoms such as worsening pain or new neurological symptoms which would prompt immediate return.  Follow up with Dr. Renaye Rakersim Murphy within the next week.     Reuben Likesavid C Dorsel Flinn, MD 01/14/14 [phone]

## 2014-01-14 NOTE — Discharge Instructions (Signed)
Do exercises twice daily followed by moist heat for 15 minutes. ° ° ° ° ° °Try to be as active as possible. ° °If no better in 2 weeks, follow up with orthopedist. ° ° °

## 2014-01-14 NOTE — ED Notes (Signed)
C/o intermittent lower back pain onset 3 months; last 2 weeks have been worse Pain increases w/activity Denies inj/trauma, urinary sx, gyn sx Alert, no signs of acute distress.

## 2015-01-29 IMAGING — CR DG KNEE COMPLETE 4+V*L*
4 series · 4 of 4 positions shown · non-contrast
Comparison: None.

CLINICAL DATA: Bilateral knee pain.

EXAM:
LEFT KNEE - COMPLETE 4+ VIEW

[t knee ap left]
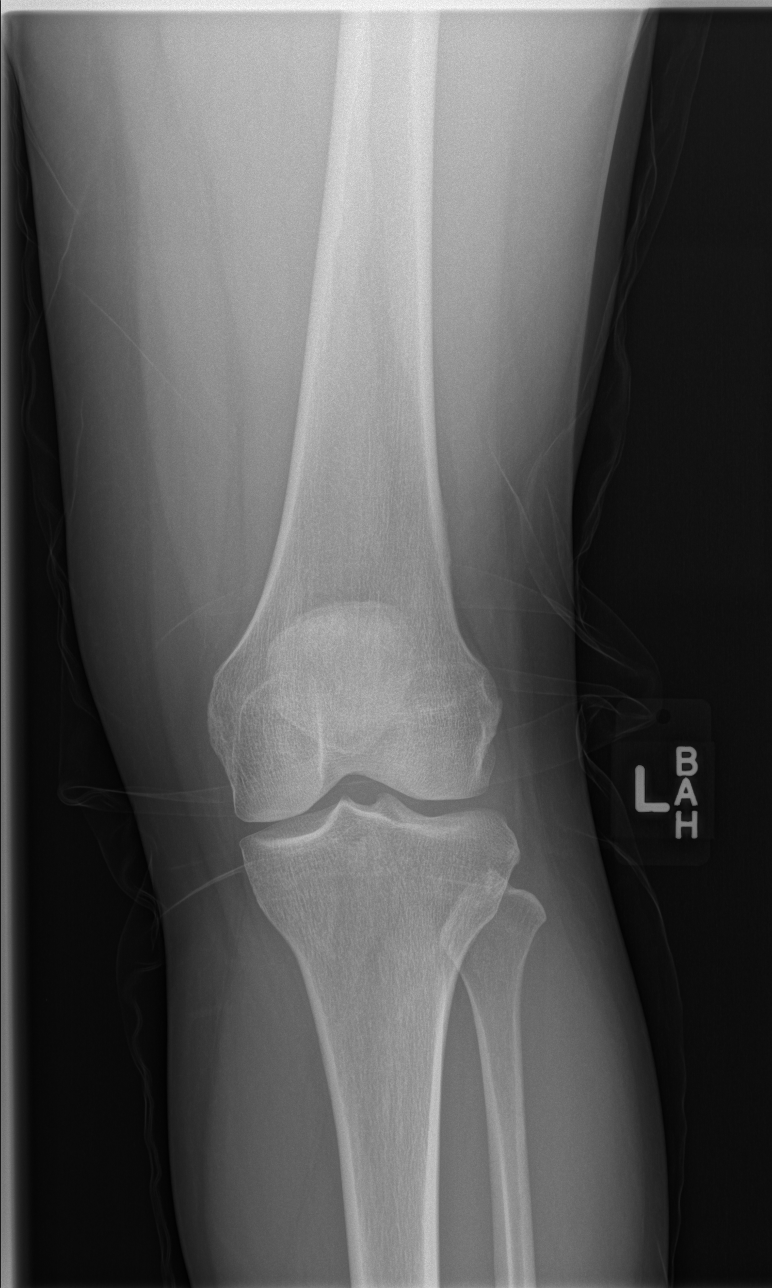

[t knee obl left (1 of 2)]
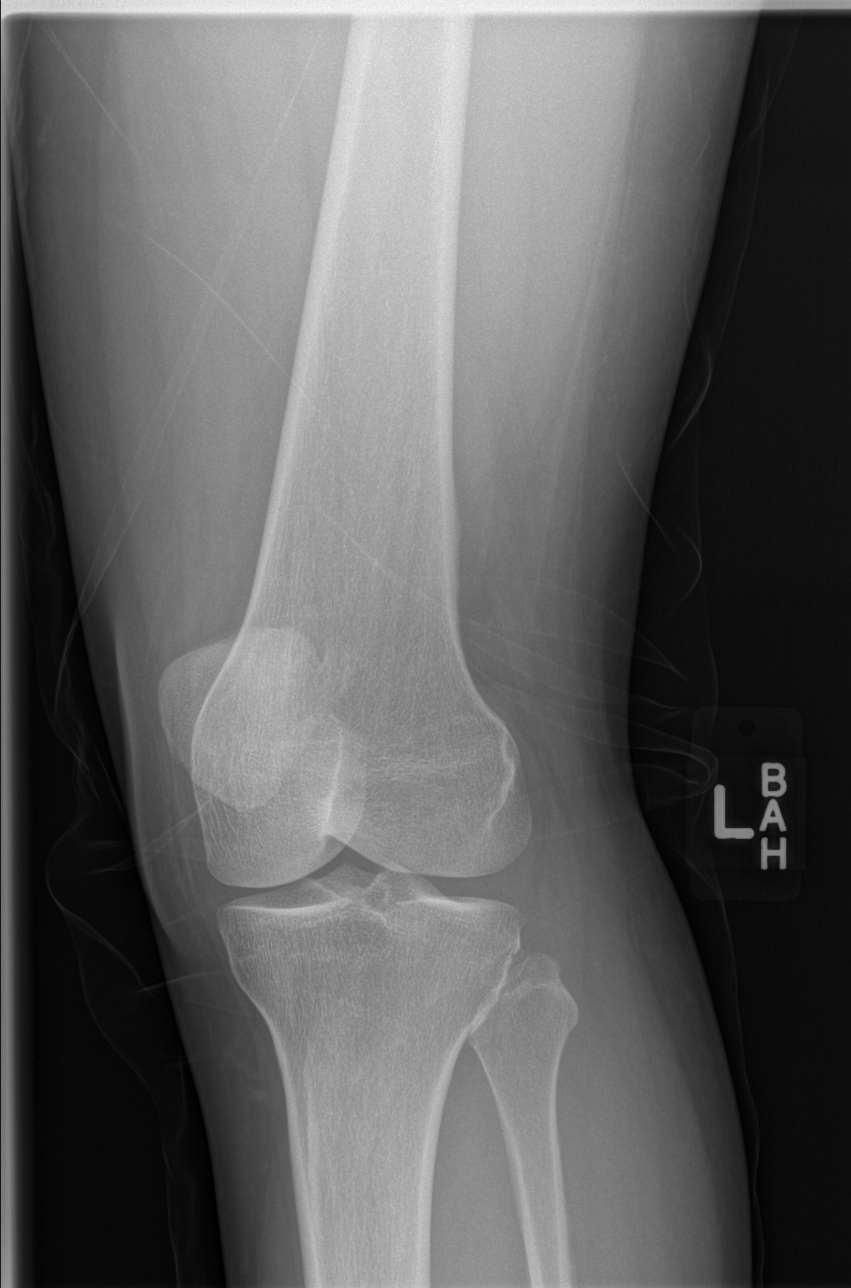

[t knee obl left (2 of 2)]
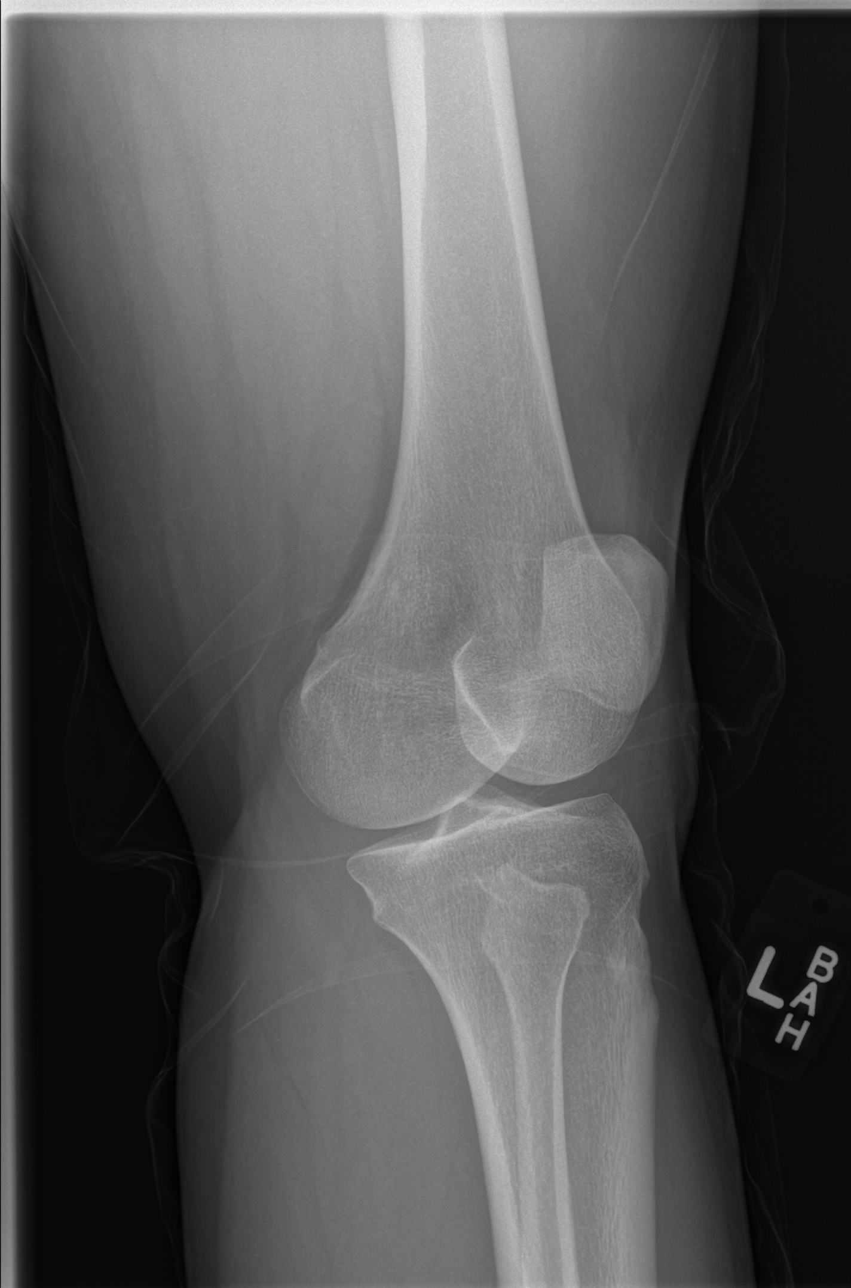

[t knee lat left]
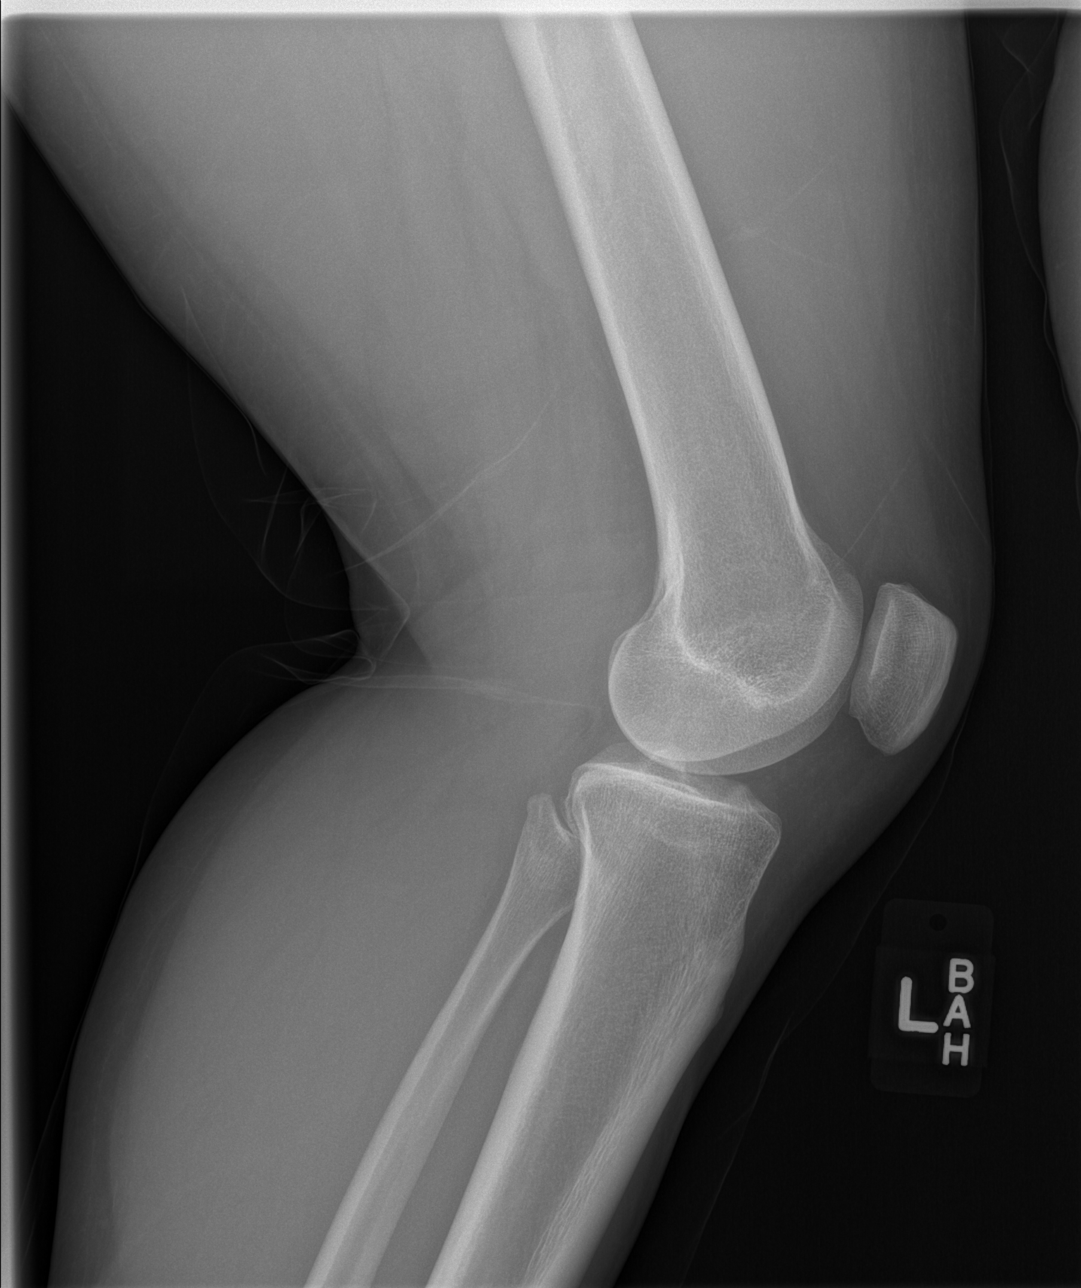

[4 of 4 positions shown; findings below may reference images not displayed]

FINDINGS: Anatomic alignment of the left knee. Medial and lateral compartment
joint space is preserved. Tiny marginal osteophytes off the patella
compatible with very mild patellar osteoarthritis. No effusion. No
fracture. No destructive osseous lesion.
IMPRESSION: Mild patellofemoral osteoarthritis.

## 2015-01-29 IMAGING — CR DG KNEE COMPLETE 4+V*R*
4 series · 4 of 4 positions shown · non-contrast
Comparison: None.

CLINICAL DATA: Bilateral knee pain for 1 year.

EXAM:
RIGHT KNEE - COMPLETE 4+ VIEW

[t knee ap right]
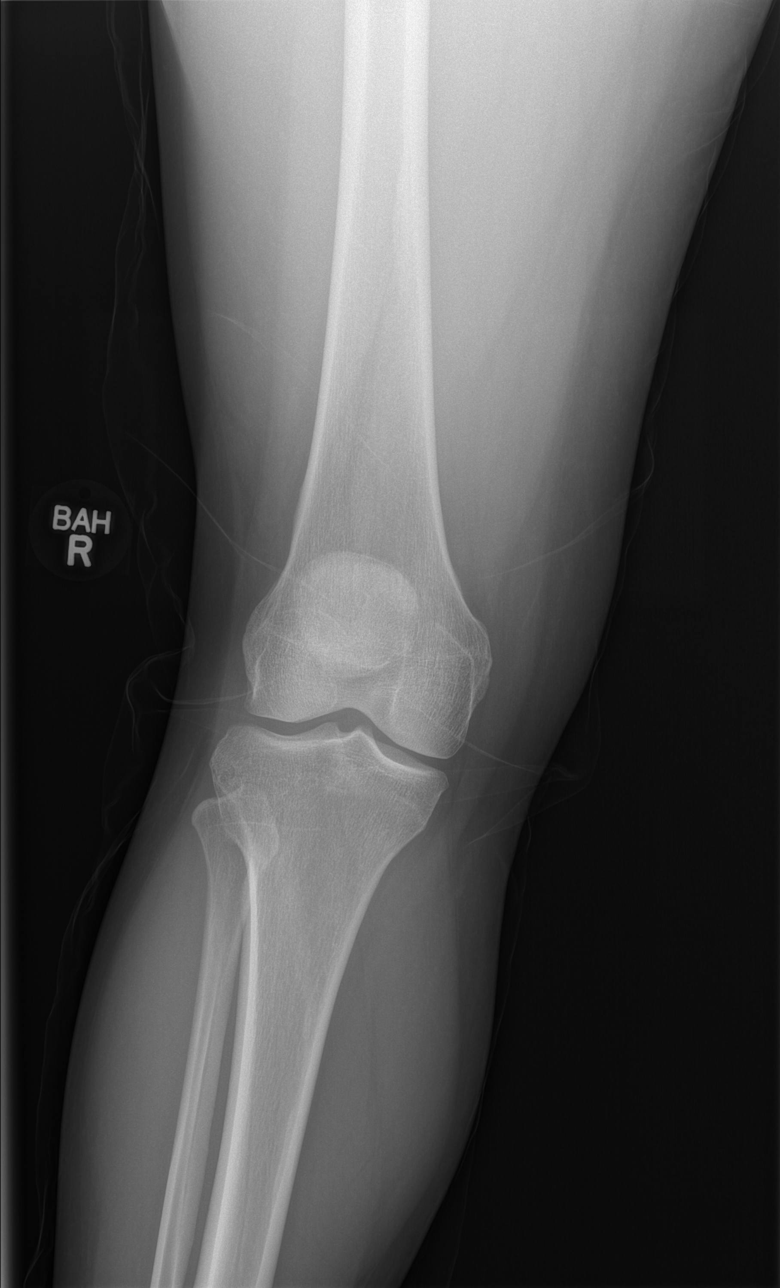

[t knee obl right (1 of 2)]
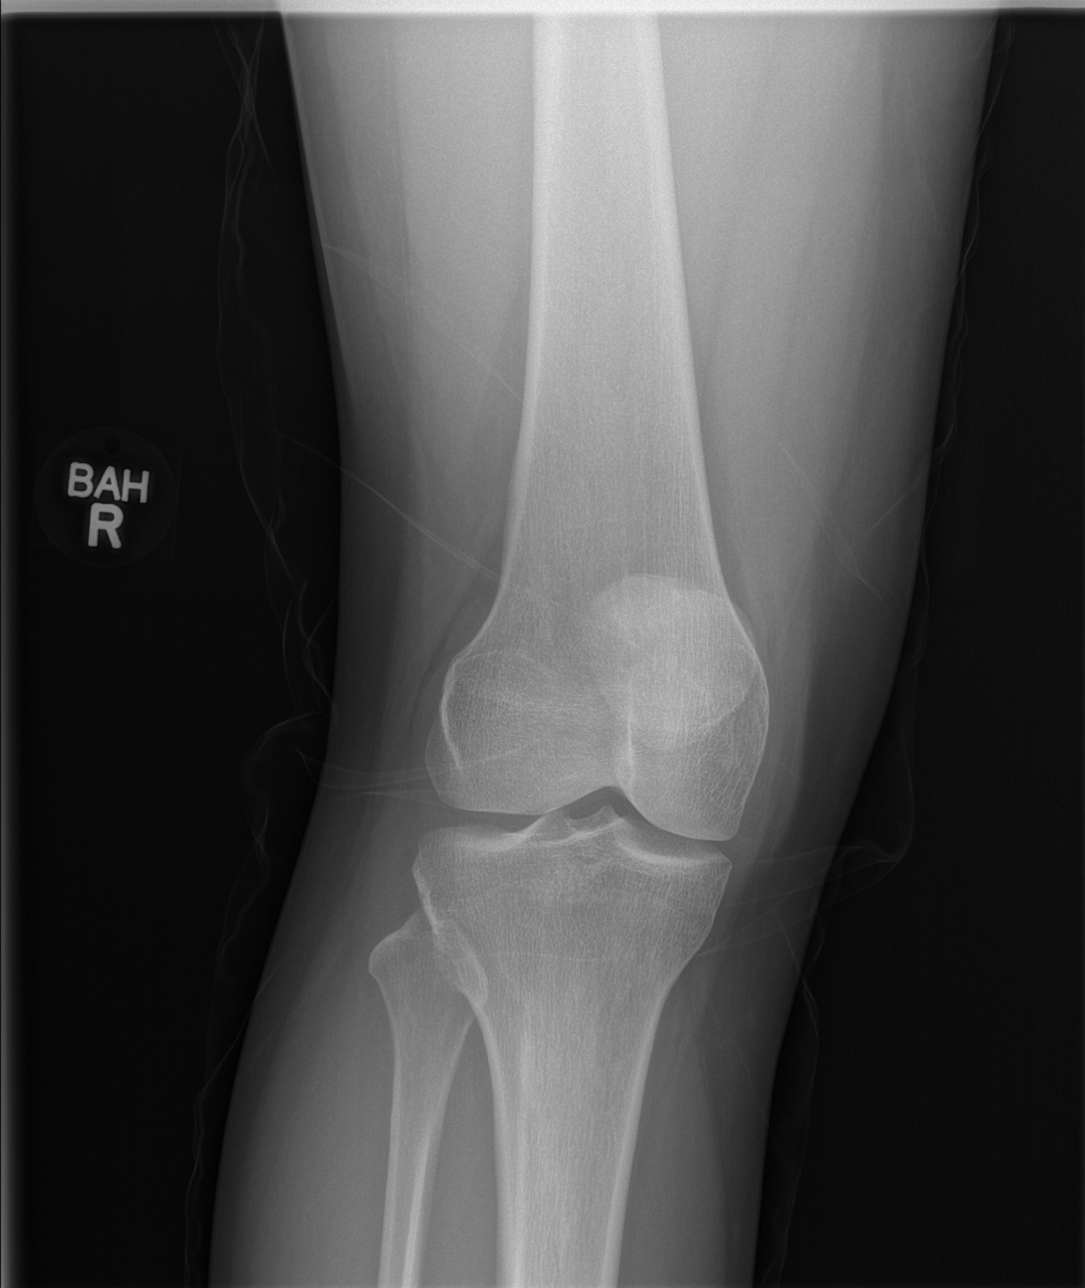

[t knee obl right (2 of 2)]
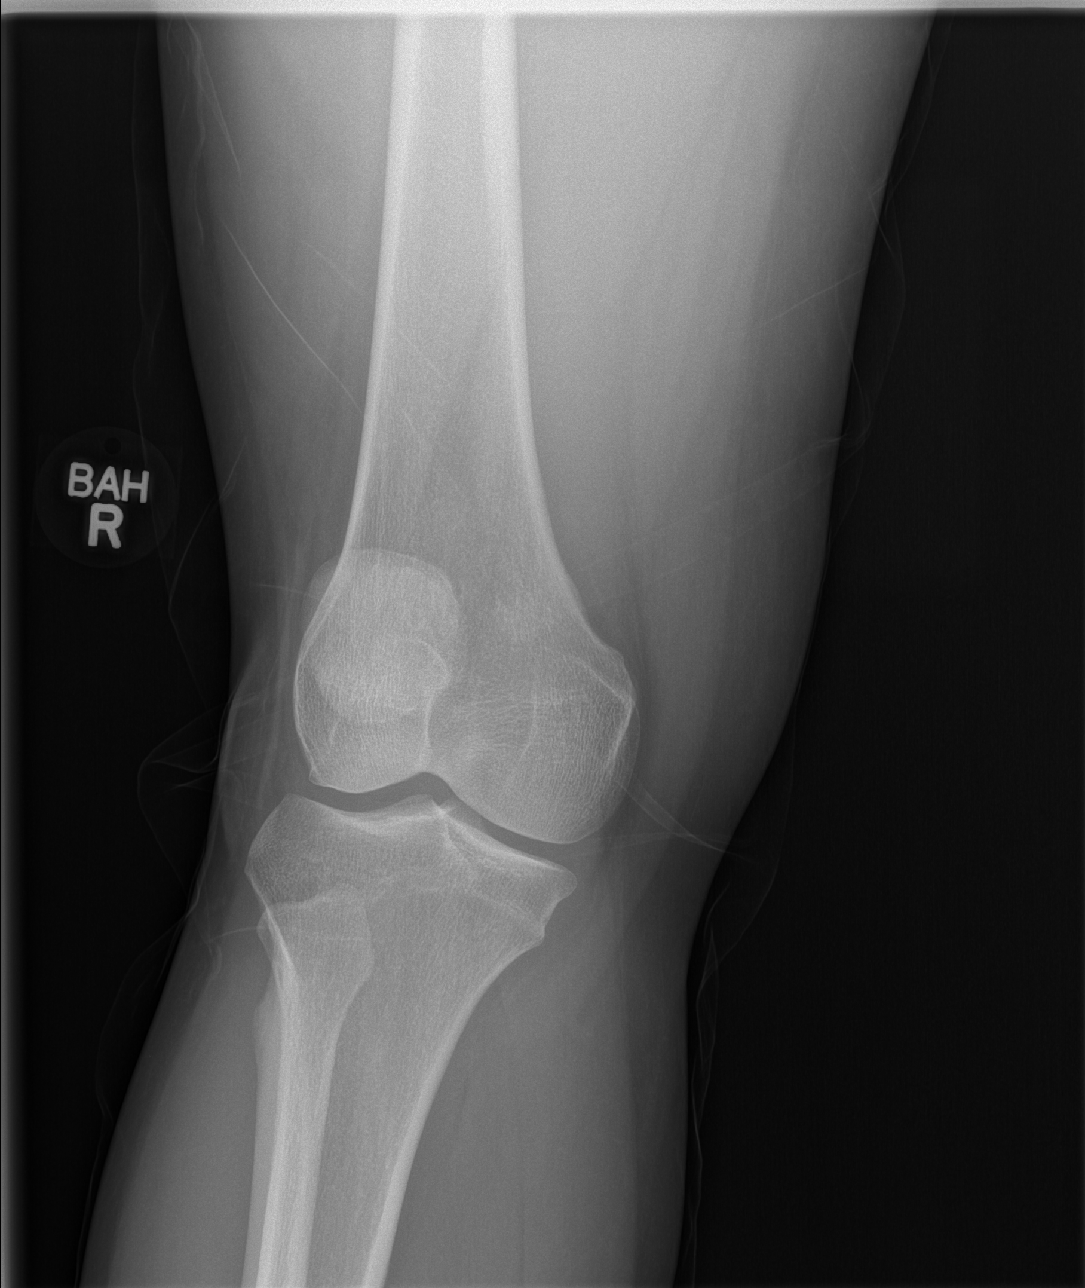

[t knee lat right]
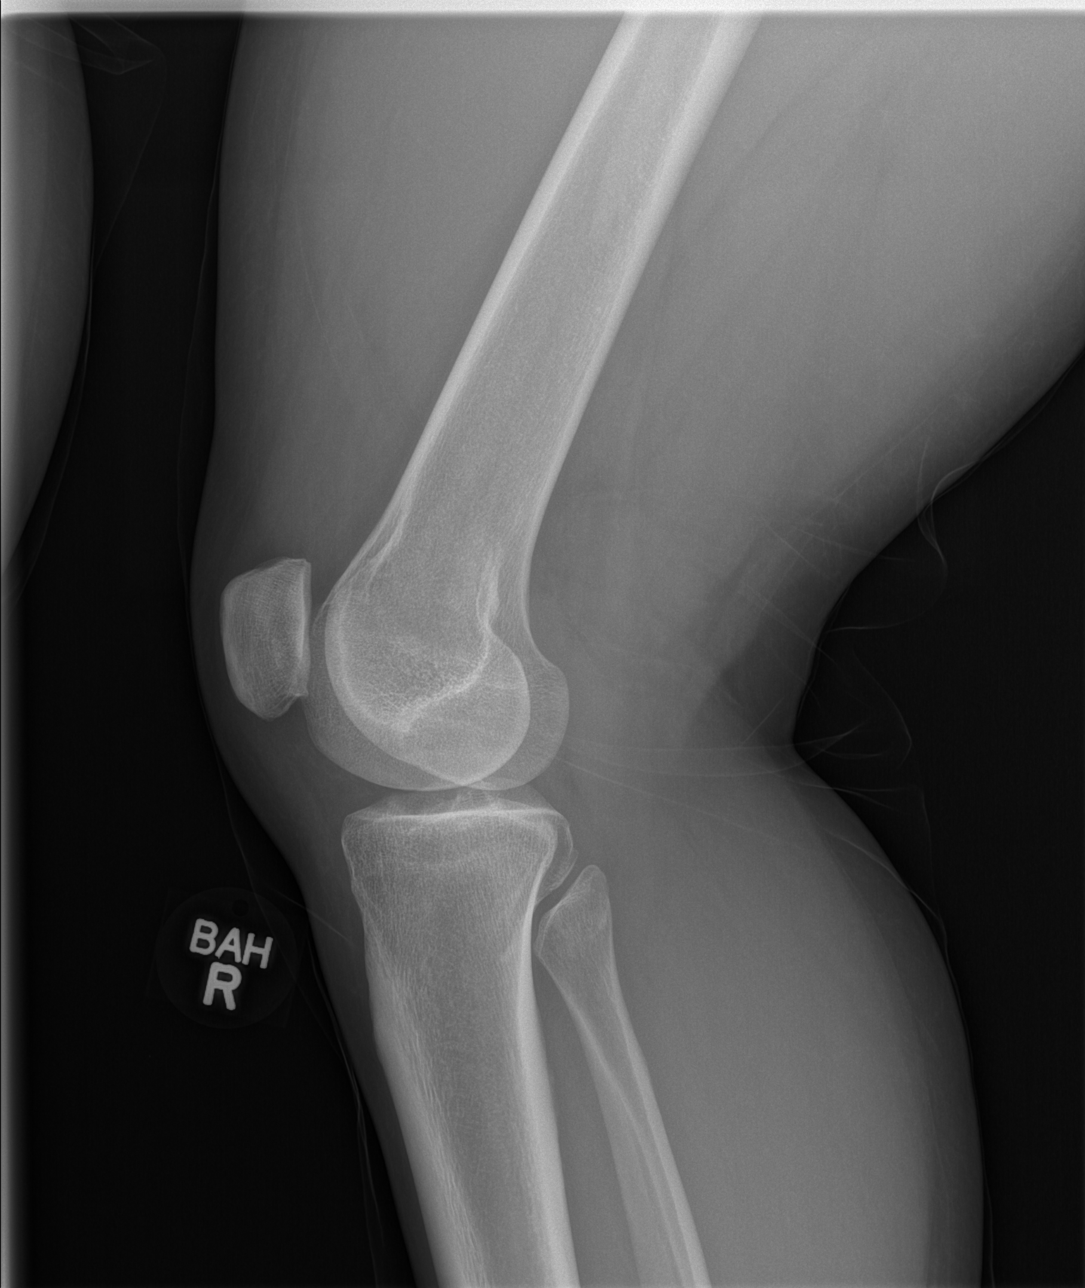

[4 of 4 positions shown; findings below may reference images not displayed]

FINDINGS: Anatomic alignment of the right knee. There is no fracture. Medial
and lateral joint spaces are preserved. There is no effusion. Mild
patellofemoral osteoarthritis is present.
IMPRESSION: Mild patellofemoral osteoarthritis.

## 2017-03-06 ENCOUNTER — Other Ambulatory Visit: Payer: Self-pay

## 2017-03-06 ENCOUNTER — Encounter (HOSPITAL_COMMUNITY): Payer: Self-pay | Admitting: Emergency Medicine

## 2017-03-06 ENCOUNTER — Observation Stay (HOSPITAL_COMMUNITY)
Admission: EM | Admit: 2017-03-06 | Discharge: 2017-03-07 | Disposition: A | Discharge status: Home / Self Care | Payer: 59 | Attending: Family Medicine | Admitting: Family Medicine

## 2017-03-06 DIAGNOSIS — Z79899 Other long term (current) drug therapy: Secondary | ICD-10-CM | POA: Insufficient documentation

## 2017-03-06 DIAGNOSIS — Z87898 Personal history of other specified conditions: Secondary | ICD-10-CM | POA: Diagnosis not present

## 2017-03-06 DIAGNOSIS — F17210 Nicotine dependence, cigarettes, uncomplicated: Secondary | ICD-10-CM | POA: Diagnosis not present

## 2017-03-06 DIAGNOSIS — M542 Cervicalgia: Secondary | ICD-10-CM | POA: Diagnosis not present

## 2017-03-06 DIAGNOSIS — R202 Paresthesia of skin: Secondary | ICD-10-CM | POA: Diagnosis not present

## 2017-03-06 DIAGNOSIS — H538 Other visual disturbances: Secondary | ICD-10-CM | POA: Insufficient documentation

## 2017-03-06 DIAGNOSIS — I10 Essential (primary) hypertension: Secondary | ICD-10-CM | POA: Diagnosis not present

## 2017-03-06 DIAGNOSIS — R51 Headache: Secondary | ICD-10-CM | POA: Diagnosis not present

## 2017-03-06 DIAGNOSIS — R200 Anesthesia of skin: Secondary | ICD-10-CM

## 2017-03-06 DIAGNOSIS — R42 Dizziness and giddiness: Secondary | ICD-10-CM | POA: Diagnosis not present

## 2017-03-06 DIAGNOSIS — Z8639 Personal history of other endocrine, nutritional and metabolic disease: Secondary | ICD-10-CM

## 2017-03-06 NOTE — ED Triage Notes (Signed)
Pt states she was arguing with her kids on Thursday and had a sharp pain like a vice grip at the back of her neck and base of her skull  Pt states since then she has had dizziness and blurred vision in her right eye   Pt states the pain at the base of her skull is like a dull ache since

## 2017-03-06 NOTE — ED Provider Notes (Signed)
TIME SEEN: 11:47 PM  CHIEF COMPLAINT: Severe headache, vertigo, right eye blurry vision  HPI: Patient is a 37 year old female with history of hypertension, Graves' disease presents to the emergency department with complaints of pressure-like headache in the posterior aspect of her head, vertiginous symptoms and right eye blurry vision.  States that she was arguing with her daughters on Thursday, January 24 when she suddenly felt like there was a "vice grip" of the base of her head.  Has never had similar headaches before.  States afterwards she developed vertigo and felt like the room was spinning.  Has also had intermittent right eye blurry vision but no double vision or vision loss.  She denies any numbness or focal weakness but does state she has some tingling in the fingertips of her right hand that has been ongoing since January 25.  No head injury.  Not on antiplatelets or anticoagulants.  Does not wear glasses or contacts.  No ear pain, hearing loss or tinnitus.  No nausea or vomiting.  No diarrhea.  No fever.  ROS: See HPI Constitutional: no fever  Eyes: no drainage  ENT: no runny nose   Cardiovascular:  no chest pain  Resp: no SOB  GI: no vomiting GU: no dysuria Integumentary: no rash  Allergy: no hives  Musculoskeletal: no leg swelling  Neurological: no slurred speech ROS otherwise negative  PAST MEDICAL HISTORY/PAST SURGICAL HISTORY:  Past Medical History:  Diagnosis Date  . Graves disease   . Hypertension   . Thyroid disease     MEDICATIONS:  Prior to Admission medications   Medication Sig Start Date End Date Taking? Authorizing Provider  HYDROcodone-acetaminophen (NORCO/VICODIN) 5-325 MG per tablet 1 to 2 tabs every 4 to 6 hours as needed for pain. 01/14/14   Reuben LikesKeller, David C, MD  LORazepam (ATIVAN) 1 MG tablet Take 1 tablet (1 mg total) by mouth 3 (three) times daily as needed for anxiety. 03/02/13   Gwyneth SproutPlunkett, Whitney, MD  meloxicam (MOBIC) 15 MG tablet Take 1 tablet (15  mg total) by mouth daily. 01/14/14   Reuben LikesKeller, David C, MD  methocarbamol (ROBAXIN) 500 MG tablet Take 1 tablet (500 mg total) by mouth 3 (three) times daily. 01/14/14   Reuben LikesKeller, David C, MD  naproxen sodium (ANAPROX) 220 MG tablet Take 220 mg by mouth as needed (for osteoarthritis).    [provider]  predniSONE (DELTASONE) 20 MG tablet Take 3 daily for 5 days, 2 daily for 5 days, 1 daily for 5 days. 01/14/14   Reuben LikesKeller, David C, MD    ALLERGIES:  No Known Allergies  SOCIAL HISTORY:  Social History   Tobacco Use  . Smoking status: Current Every Day Smoker    Packs/day: 5.00    Types: Cigarettes  . Smokeless tobacco: Never Used  Substance Use Topics  . Alcohol use: Yes    Comment: occ    FAMILY HISTORY: Family History  Problem Relation Age of Onset  . Rheum arthritis Mother   . Asthma Mother   . Asthma Father     EXAM: BP 129/86 (BP Location: Left Arm)   Pulse 73   Temp 98.1 F (36.7 C) (Oral)   Resp 18   Ht 5\' 2"  (1.575 m)   Wt 70.3 kg (155 lb)   LMP 02/11/2017 (Exact Date)   SpO2 98%   BMI 28.35 kg/m  CONSTITUTIONAL: Alert and oriented and responds appropriately to questions. Well-appearing; well-nourished HEAD: Normocephalic EYES: Conjunctivae clear, pupils appear equal, EOMI ENT: normal nose;  moist mucous membranes NECK: Supple, no meningismus, no nuchal rigidity, no LAD; unable to reproduce pain with palpation of the neck, no redness, warmth, ecchymosis noted.  No carotid bruit appreciated.  No pharyngeal erythema or petechiae, no tonsillar hypertrophy or exudate, no uvular deviation, no unilateral swelling, no trismus or drooling, no muffled voice, normal phonation, no stridor, no dental caries present, no drainable dental abscess noted, no Ludwig's angina, tongue sits flat in the bottom of the mouth, no angioedema, no facial erythema or warmth, no facial swelling; no pain with movement of the neck.  TMs are clear bilaterally without erythema, purulence, bulging,  perforation, effusion.  No cerumen impaction or sign of foreign body in the external auditory canal. No inflammation, erythema or drainage from the external auditory canal. No signs of mastoiditis. No pain with manipulation of the pinna bilaterally. CARD: RRR; S1 and S2 appreciated; no murmurs, no clicks, no rubs, no gallops RESP: Normal chest excursion without splinting or tachypnea; breath sounds clear and equal bilaterally; no wheezes, no rhonchi, no rales, no hypoxia or respiratory distress, speaking full sentences ABD/GI: Normal bowel sounds; non-distended; soft, non-tender, no rebound, no guarding, no peritoneal signs, no hepatosplenomegaly BACK:  The back appears normal and is non-tender to palpation, there is no CVA tenderness EXT: Normal ROM in all joints; non-tender to palpation; no edema; normal capillary refill; no cyanosis, no calf tenderness or swelling    SKIN: Normal color for age and race; warm; no rash NEURO: Moves all extremities equally, strength 5/5 in all 4 extremities, cranial nerves II through XII intact, reports diminished sensation in her left face compared to the right but otherwise sensation to light touch intact diffusely, no dysmetria to finger to nose testing bilaterally, patient does have fatigable horizontal nystagmus when looking to the left which reproduces her vertiginous symptoms PSYCH: The patient's mood and manner are appropriate. Grooming and personal hygiene are appropriate.  MEDICAL DECISION MAKING: Patient here with concerning symptoms of sudden onset headache, right eye blurry vision and vertigo.  States started after arguing with her daughters.  Concern for possible stroke, intracranial hemorrhage, dissection.  Will obtain CTA of her head and neck.  Symptoms started almost a week ago.  Outside of any TPA window.  We will treat symptomatically with IV fluids, Reglan, Benadryl and meclizine.  Will obtain labs.  ED PROGRESS: Labs unremarkable.  CTA of the head  and neck showed no hemorrhage, aneurysm, dissection or stenosis.  She is still having vertigo, left-sided facial numbness, right eye blurry vision.  Discussed with Dr. Winona Legato with telemetry neurology who has recommended admission to medicine for a stroke workup.  Recommends obtaining an MRI of the brain with and without contrast.  Patient is agreeable to this plan.  She does not have a local primary care physician.   5:25 AM Discussed patient's case with hospitalist, Dr. Toniann Fail.  I have recommended admission and patient (and family if present) agree with this plan. Admitting physician will place admission orders.   I reviewed all nursing notes, vitals, pertinent previous records, EKGs, lab and urine results, imaging (as available).       Ward, Layla Maw, DO 03/07/17 [phone]

## 2017-03-07 ENCOUNTER — Encounter (HOSPITAL_COMMUNITY): Payer: Self-pay

## 2017-03-07 ENCOUNTER — Observation Stay (HOSPITAL_COMMUNITY): Payer: 59

## 2017-03-07 ENCOUNTER — Emergency Department (HOSPITAL_COMMUNITY): Payer: 59

## 2017-03-07 DIAGNOSIS — M542 Cervicalgia: Secondary | ICD-10-CM | POA: Diagnosis not present

## 2017-03-07 DIAGNOSIS — R51 Headache: Secondary | ICD-10-CM | POA: Diagnosis not present

## 2017-03-07 DIAGNOSIS — R42 Dizziness and giddiness: Secondary | ICD-10-CM | POA: Diagnosis not present

## 2017-03-07 DIAGNOSIS — Z8639 Personal history of other endocrine, nutritional and metabolic disease: Secondary | ICD-10-CM | POA: Diagnosis not present

## 2017-03-07 DIAGNOSIS — F17210 Nicotine dependence, cigarettes, uncomplicated: Secondary | ICD-10-CM | POA: Diagnosis not present

## 2017-03-07 DIAGNOSIS — Z79899 Other long term (current) drug therapy: Secondary | ICD-10-CM | POA: Diagnosis not present

## 2017-03-07 DIAGNOSIS — H538 Other visual disturbances: Secondary | ICD-10-CM | POA: Diagnosis not present

## 2017-03-07 DIAGNOSIS — Z87898 Personal history of other specified conditions: Secondary | ICD-10-CM | POA: Diagnosis not present

## 2017-03-07 DIAGNOSIS — I10 Essential (primary) hypertension: Secondary | ICD-10-CM | POA: Diagnosis not present

## 2017-03-07 LAB — CBC WITH DIFFERENTIAL/PLATELET
BASOS ABS: 0 10*3/uL (ref 0.0–0.1)
BASOS PCT: 0 %
Eosinophils Absolute: 0.1 10*3/uL (ref 0.0–0.7)
Eosinophils Relative: 1 %
HEMATOCRIT: 39.7 % (ref 36.0–46.0)
Hemoglobin: 13.3 g/dL (ref 12.0–15.0)
Lymphocytes Relative: 41 %
Lymphs Abs: 3.8 10*3/uL (ref 0.7–4.0)
MCH: 27.6 pg (ref 26.0–34.0)
MCHC: 33.5 g/dL (ref 30.0–36.0)
MCV: 82.4 fL (ref 78.0–100.0)
Monocytes Absolute: 0.5 10*3/uL (ref 0.1–1.0)
Monocytes Relative: 5 %
NEUTROS ABS: 4.8 10*3/uL (ref 1.7–7.7)
Neutrophils Relative %: 53 %
Platelets: 214 10*3/uL (ref 150–400)
RBC: 4.82 MIL/uL (ref 3.87–5.11)
RDW: 13.6 % (ref 11.5–15.5)
WBC: 9.2 10*3/uL (ref 4.0–10.5)

## 2017-03-07 LAB — BASIC METABOLIC PANEL
Anion gap: 7 (ref 5–15)
BUN: 14 mg/dL (ref 6–20)
CALCIUM: 9.3 mg/dL (ref 8.9–10.3)
CO2: 23 mmol/L (ref 22–32)
CREATININE: 0.87 mg/dL (ref 0.44–1.00)
Chloride: 108 mmol/L (ref 101–111)
GFR calc non Af Amer: >60 mL/min (ref >60)
Glucose, Bld: 90 mg/dL (ref 65–99)
Potassium: 3.4 mmol/L — ABNORMAL LOW (ref 3.5–5.1)
Sodium: 138 mmol/L (ref 135–145)

## 2017-03-07 LAB — PROTIME-INR
INR: 0.98
Prothrombin Time: 12.9 seconds (ref 11.4–15.2)

## 2017-03-07 LAB — TSH: TSH: 2.306 u[IU]/mL (ref 0.350–4.500)

## 2017-03-07 LAB — APTT: aPTT: 32 seconds (ref 24–36)

## 2017-03-07 LAB — I-STAT BETA HCG BLOOD, ED (MC, WL, AP ONLY): I-stat hCG, quantitative: <5 m[IU]/mL (ref <5)

## 2017-03-07 LAB — T4, FREE: FREE T4: 0.93 ng/dL (ref 0.61–1.12)

## 2017-03-07 MED ORDER — DIPHENHYDRAMINE HCL 50 MG/ML IJ SOLN
25.0000 mg | Freq: Once | INTRAMUSCULAR | Status: AC
  Administered 2017-03-07: 25 mg via INTRAVENOUS
  Filled 2017-03-07: qty 1

## 2017-03-07 MED ORDER — MECLIZINE HCL 25 MG PO TABS: 25.0000 mg | ORAL_TABLET | Freq: Three times a day (TID) | ORAL | 0 refills | Status: DC | PRN

## 2017-03-07 MED ORDER — METOCLOPRAMIDE HCL 5 MG/ML IJ SOLN
10.0000 mg | Freq: Once | INTRAMUSCULAR | Status: AC
  Administered 2017-03-07: 10 mg via INTRAVENOUS
  Filled 2017-03-07: qty 2

## 2017-03-07 MED ORDER — IOPAMIDOL (ISOVUE-370) INJECTION 76%
100.0000 mL | Freq: Once | INTRAVENOUS | Status: AC | PRN
  Administered 2017-03-07: 100 mL via INTRAVENOUS

## 2017-03-07 MED ORDER — IOPAMIDOL (ISOVUE-370) INJECTION 76%
INTRAVENOUS | Status: AC
  Administered 2017-03-07: 100 mL via INTRAVENOUS
  Filled 2017-03-07: qty 100

## 2017-03-07 MED ORDER — MELOXICAM 15 MG PO TABS: 15.0000 mg | ORAL_TABLET | Freq: Every day | ORAL | 0 refills | Status: DC

## 2017-03-07 MED ORDER — ACETAMINOPHEN 325 MG PO TABS: 650.0000 mg | ORAL_TABLET | ORAL | Status: DC | PRN

## 2017-03-07 MED ORDER — SODIUM CHLORIDE 0.9 % IV SOLN
INTRAVENOUS | Status: DC
  Administered 2017-03-07: 09:00:00 via INTRAVENOUS

## 2017-03-07 MED ORDER — ACETAMINOPHEN 650 MG RE SUPP: 650.0000 mg | RECTAL | Status: DC | PRN

## 2017-03-07 MED ORDER — POTASSIUM CHLORIDE CRYS ER 20 MEQ PO TBCR
40.0000 meq | EXTENDED_RELEASE_TABLET | Freq: Once | ORAL | Status: AC
  Administered 2017-03-07: 40 meq via ORAL
  Filled 2017-03-07: qty 2

## 2017-03-07 MED ORDER — GADOBENATE DIMEGLUMINE 529 MG/ML IV SOLN
15.0000 mL | Freq: Once | INTRAVENOUS | Status: AC | PRN
  Administered 2017-03-07: 14 mL via INTRAVENOUS

## 2017-03-07 MED ORDER — MECLIZINE HCL 25 MG PO TABS
25.0000 mg | ORAL_TABLET | Freq: Once | ORAL | Status: AC
Start: 2017-03-07 — End: 2017-03-07
  Administered 2017-03-07: 25 mg via ORAL
  Filled 2017-03-07: qty 1

## 2017-03-07 MED ORDER — HYDROXYZINE HCL 25 MG PO TABS: 25.0000 mg | ORAL_TABLET | Freq: Three times a day (TID) | ORAL | 0 refills | Status: DC | PRN

## 2017-03-07 MED ORDER — SODIUM CHLORIDE 0.9 % IV BOLUS (SEPSIS)
1000.0000 mL | Freq: Once | INTRAVENOUS | Status: AC
  Administered 2017-03-07: 1000 mL via INTRAVENOUS

## 2017-03-07 MED ORDER — ACETAMINOPHEN 160 MG/5ML PO SOLN: 650.0000 mg | ORAL | Status: DC | PRN

## 2017-03-07 NOTE — ED Notes (Signed)
Pt in MRI at present time.

## 2017-03-07 NOTE — H&P (Signed)
History and Physical    [name] ZOX:[id number] DOB: [birth year] DOA: 03/06/2017  PCP: Patient, No Pcp Per  Patient coming from: Home.  Chief Complaint: Right eye blurry vision.  HPI: Yolanda Estrada is a 37 y.o. female with history of Graves' disease has not been taking any medications presents to the ER with complaints of having blurry vision with dizziness.  Patient states her symptoms started 5 days ago after she had shouted at her kids.  Her head started feeling full and pressurized with her right eye getting blurred off and on.  Patient also at the same time started feeling dizzy on walking.  Symptoms persisted for last 5 days and came to the ER.  Denies any weakness of the upper or lower extremities or any nausea vomiting or difficulty speaking or swallowing.  ED Course: In the ER patient appears nonfocal.  Difficulty on walking due to dizziness.  CT angiogram of the head and neck was negative.  Tele neurology was consulted by the ER physician and at this time neurologist recommended to get MRI brain with and without contrast.  Patient admitted for further workup.  Review of Systems: As per HPI, rest all negative.   Past Medical History:  Diagnosis Date  . Graves disease   . Hypertension   . Thyroid disease     Past Surgical History:  Procedure Laterality Date  . TUBAL LIGATION       reports that she has been smoking cigarettes.  She has been smoking about 5.00 packs per day. she has never used smokeless tobacco. She reports that she drinks alcohol. She reports that she does not use drugs.  No Known Allergies  Family History  Problem Relation Age of Onset  . Rheum arthritis Mother   . Asthma Mother   . Asthma Father     Prior to Admission medications   Medication Sig Start Date End Date Taking? Authorizing Provider  Multiple Vitamins-Minerals (ADULT GUMMY) CHEW Chew 1 each by mouth daily.   Yes [provider]  HYDROcodone-acetaminophen  (NORCO/VICODIN) 5-325 MG per tablet 1 to 2 tabs every 4 to 6 hours as needed for pain. Patient not taking: Reported on 03/07/2017 01/14/14   Reuben Likes, MD  LORazepam (ATIVAN) 1 MG tablet Take 1 tablet (1 mg total) by mouth 3 (three) times daily as needed for anxiety. Patient not taking: Reported on 03/07/2017 03/02/13   Gwyneth Sprout, MD  meloxicam (MOBIC) 15 MG tablet Take 1 tablet (15 mg total) by mouth daily. Patient not taking: Reported on 03/07/2017 01/14/14   Reuben Likes, MD  methocarbamol (ROBAXIN) 500 MG tablet Take 1 tablet (500 mg total) by mouth 3 (three) times daily. Patient not taking: Reported on 03/07/2017 01/14/14   Reuben Likes, MD  predniSONE (DELTASONE) 20 MG tablet Take 3 daily for 5 days, 2 daily for 5 days, 1 daily for 5 days. Patient not taking: Reported on 03/07/2017 01/14/14   Reuben Likes, MD    Physical Exam: Vitals:   03/07/17 0500 03/07/17 0526 03/07/17 0552 03/07/17 0558  BP: 107/70  107/70   Pulse: 64 81 67   Resp:   20   Temp:    98.7 F (37.1 C)  TempSrc:      SpO2: 99% 100% 100%   Weight:      Height:          Constitutional: Moderately built and nourished. Vitals:   03/07/17 0500 03/07/17 8119 03/07/17 1478 03/07/17 2956  BP: 107/70  107/70   Pulse: 64 81 67   Resp:   20   Temp:    98.7 F (37.1 C)  TempSrc:      SpO2: 99% 100% 100%   Weight:      Height:       Eyes: Anicteric no pallor. ENMT: No discharge from the ears eyes nose or mouth. Neck: Mildly enlarged thyroid gland. Respiratory: No rhonchi or crepitations. Cardiovascular: S1-S2 heard no murmurs appreciated. Abdomen: Soft nontender bowel sounds present. Musculoskeletal: No edema.  No joint effusion. Skin: No rash.  Skin appears warm. Neurologic: Alert awake oriented to time place and person.  Moves all extremities 5 x 5.  No facial asymmetry tongue is midline.  Pupils are equal and reacting to light. Psychiatric: Appears normal.  Normal affect.   Labs on  Admission: I have personally reviewed following labs and imaging studies  CBC: Recent Labs  Lab 03/07/17 0000  WBC 9.2  NEUTROABS 4.8  HGB 13.3  HCT 39.7  MCV 82.4  PLT 214   Basic Metabolic Panel: Recent Labs  Lab 03/07/17 0000  NA 138  K 3.4*  CL 108  CO2 23  GLUCOSE 90  BUN 14  CREATININE 0.87  CALCIUM 9.3   GFR: Estimated Creatinine Clearance: 82.1 mL/min (by C-G formula based on SCr of 0.87 mg/dL). Liver Function Tests: No results for input(s): AST, ALT, ALKPHOS, BILITOT, PROT, ALBUMIN in the last 168 hours. No results for input(s): LIPASE, AMYLASE in the last 168 hours. No results for input(s): AMMONIA in the last 168 hours. Coagulation Profile: Recent Labs  Lab 03/07/17 0028  INR 0.98   Cardiac Enzymes: No results for input(s): CKTOTAL, CKMB, CKMBINDEX, TROPONINI in the last 168 hours. BNP (last 3 results) No results for input(s): PROBNP in the last 8760 hours. HbA1C: No results for input(s): HGBA1C in the last 72 hours. CBG: No results for input(s): GLUCAP in the last 168 hours. Lipid Profile: No results for input(s): CHOL, HDL, LDLCALC, TRIG, CHOLHDL, LDLDIRECT in the last 72 hours. Thyroid Function Tests: No results for input(s): TSH, T4TOTAL, FREET4, T3FREE, THYROIDAB in the last 72 hours. Anemia Panel: No results for input(s): VITAMINB12, FOLATE, FERRITIN, TIBC, IRON, RETICCTPCT in the last 72 hours. Urine analysis:    Component Value Date/Time   COLORURINE YELLOW 03/02/2013 1439   APPEARANCEUR CLEAR 03/02/2013 1439   LABSPEC >=1.030 01/14/2014 1834   PHURINE 6.0 01/14/2014 1834   GLUCOSEU NEGATIVE 01/14/2014 1834   HGBUR MODERATE (A) 01/14/2014 1834   BILIRUBINUR NEGATIVE 01/14/2014 1834   KETONESUR NEGATIVE 01/14/2014 1834   PROTEINUR NEGATIVE 01/14/2014 1834   UROBILINOGEN 0.2 01/14/2014 1834   NITRITE NEGATIVE 01/14/2014 1834   LEUKOCYTESUR NEGATIVE 01/14/2014 1834   Sepsis Labs: @LABRCNTIP (procalcitonin:4,lacticidven:4) )No  results found for this or any previous visit (from the past 240 hour(s)).   Radiological Exams on Admission: Ct Angio Head W Or Wo Contrast  Result Date: 03/07/2017 CLINICAL DATA:  37 y/o F; severe headache and base of skull radiating into posterior neck with dizziness and blurred vision in the right eye. EXAM: CT ANGIOGRAPHY HEAD AND NECK TECHNIQUE: Multidetector CT imaging of the head and neck was performed using the standard protocol during bolus administration of intravenous contrast. Multiplanar CT image reconstructions and MIPs were obtained to evaluate the vascular anatomy. Carotid stenosis measurements (when applicable) are obtained utilizing NASCET criteria, using the distal internal carotid diameter as the denominator. CONTRAST:  100 cc Isovue 370 COMPARISON:  03/02/2013 CT head FINDINGS:  CT HEAD FINDINGS Brain: No evidence of acute infarction, hemorrhage, hydrocephalus, extra-axial collection or mass lesion/mass effect. Vascular: As below. Skull: Normal. Negative for fracture or focal lesion. Sinuses: Imaged portions are clear. Orbits: No acute finding. Review of the MIP images confirms the above findings CTA NECK FINDINGS Aortic arch: Not included within the field of view. Right carotid system: No evidence of dissection, stenosis (50% or greater) or occlusion. Left carotid system: No evidence of dissection, stenosis (50% or greater) or occlusion. Vertebral arteries: Codominant. No evidence of dissection, stenosis (50% or greater) or occlusion. Skeleton: Negative. Other neck: Negative. Upper chest: Negative. Review of the MIP images confirms the above findings CTA HEAD FINDINGS Anterior circulation: No significant stenosis, proximal occlusion, aneurysm, or vascular malformation. Posterior circulation: No significant stenosis, proximal occlusion, aneurysm, or vascular malformation. Venous sinuses: As permitted by contrast timing, patent. Anatomic variants: Right fetal PCA. Small anterior and left  posterior communicating arteries. Delayed phase: No abnormal intracranial enhancement. Review of the MIP images confirms the above findings Brain: No evidence of acute infarction, hemorrhage, hydrocephalus, extra-axial collection or mass lesion/mass effect. Vascular: As below. IMPRESSION: 1. Normal noncontrast CT of the head. No abnormal enhancement of the brain. 2. Patent carotid and vertebral arteries. No dissection, aneurysm, or hemodynamically significant stenosis utilizing NASCET criteria. 3. Patent anterior and posterior intracranial circulation. No large vessel occlusion, aneurysm, or significant stenosis. Electronically Signed   By: Mitzi HansenLance  Furusawa-Stratton M.D.   On: 03/07/2017 02:14   Ct Angio Neck W And/or Wo Contrast  Result Date: 03/07/2017 CLINICAL DATA:  37 y/o F; severe headache and base of skull radiating into posterior neck with dizziness and blurred vision in the right eye. EXAM: CT ANGIOGRAPHY HEAD AND NECK TECHNIQUE: Multidetector CT imaging of the head and neck was performed using the standard protocol during bolus administration of intravenous contrast. Multiplanar CT image reconstructions and MIPs were obtained to evaluate the vascular anatomy. Carotid stenosis measurements (when applicable) are obtained utilizing NASCET criteria, using the distal internal carotid diameter as the denominator. CONTRAST:  100 cc Isovue 370 COMPARISON:  03/02/2013 CT head FINDINGS: CT HEAD FINDINGS Brain: No evidence of acute infarction, hemorrhage, hydrocephalus, extra-axial collection or mass lesion/mass effect. Vascular: As below. Skull: Normal. Negative for fracture or focal lesion. Sinuses: Imaged portions are clear. Orbits: No acute finding. Review of the MIP images confirms the above findings CTA NECK FINDINGS Aortic arch: Not included within the field of view. Right carotid system: No evidence of dissection, stenosis (50% or greater) or occlusion. Left carotid system: No evidence of dissection,  stenosis (50% or greater) or occlusion. Vertebral arteries: Codominant. No evidence of dissection, stenosis (50% or greater) or occlusion. Skeleton: Negative. Other neck: Negative. Upper chest: Negative. Review of the MIP images confirms the above findings CTA HEAD FINDINGS Anterior circulation: No significant stenosis, proximal occlusion, aneurysm, or vascular malformation. Posterior circulation: No significant stenosis, proximal occlusion, aneurysm, or vascular malformation. Venous sinuses: As permitted by contrast timing, patent. Anatomic variants: Right fetal PCA. Small anterior and left posterior communicating arteries. Delayed phase: No abnormal intracranial enhancement. Review of the MIP images confirms the above findings Brain: No evidence of acute infarction, hemorrhage, hydrocephalus, extra-axial collection or mass lesion/mass effect. Vascular: As below. IMPRESSION: 1. Normal noncontrast CT of the head. No abnormal enhancement of the brain. 2. Patent carotid and vertebral arteries. No dissection, aneurysm, or hemodynamically significant stenosis utilizing NASCET criteria. 3. Patent anterior and posterior intracranial circulation. No large vessel occlusion, aneurysm, or significant stenosis. Electronically Signed  By: Mitzi Hansen M.D.   On: 03/07/2017 02:14    EKG: Independently reviewed.  Normal sinus rhythm.  Assessment/Plan Principal Problem:   Dizziness Active Problems:   History of Graves' disease    1. Right-sided blurry vision with vertigo/dizziness and fullness of the head -discussed with on-call neurologist Dr. Otelia Limes.  Dr. Otelia Limes advised to get MRI brain with and without contrast as advised by the tele neurologist.  If MRI is negative will need no further neurology workup as per the neurologist.  Get physical therapy consult. 2. History of Graves' disease -we will check thyroid function test. 3. Tobacco abuse -advised to quit smoking.   DVT prophylaxis: SCDs. Code  Status: Full code. Family Communication: Discussed with patient. Disposition Plan: Home. Consults called: Discussed with neurologist. Admission status: Observation.   Eduard Clos MD [facility] Pager [phone].  If 7PM-7AM, please contact night-coverage [email or web address] Password [email or web address]  03/07/2017, 6:00 AM

## 2017-03-07 NOTE — Progress Notes (Signed)
Physical Therapy Treatment Patient Details Name: Yolanda Estrada MRN: [id number] DOB: 09/05/1980 Today's Date: 03/07/2017    History of Present Illness Yolanda Estrada is a 37 y.o. female with history of Graves' disease presents to the ER 03/06/17 with complaints of having blurry vision with dizziness. MRI negative per report.    PT Comments    Evaluating therapist requested return to perform positional testing to rule out BPPV as cause of dizziness.  Horizontal canal testing performed and negative for nystagmus.  Also performed [facility] and negative for nystagmus bilaterally, although pt reports 10 seconds of "dizziness" on left side and 15 seconds on right side.  Monitored BP during session: 94/50 mmHg - 115/76 (sitting) - 120/86 (ambulating) - 124/67 (return to supine).  Pt reports dizziness as room moving and states typically short lived however does not always completely resolve.  Pt states dizziness started on Thursday when she was yelling at her children and reports high stress level with 4 daughters ages 72 - 45 years old.  Pt also describes neck tension and posterior head pressure.  Pt reports elevated BP at least 3 times/ week (works for [employer] and uses BP machine in the office).  Pt does not have a PCP so encouraged pt to establish and f/u with PCP regarding BP.  Encouraged pt take notes of time/date of BP and bring log to PCP.  Discussed possibility of how BP fluctuations and stress could be contributing to her symptoms and again encouraged follow up with PCP.  No nystagmus observed during session.      Follow Up Recommendations  No PT follow up     Equipment Recommendations  None recommended by PT    Recommendations for Other Services       Precautions / Restrictions Precautions Precautions: Fall    Mobility  Bed Mobility Overal bed mobility: Modified Independent                Transfers Overall transfer level: Needs assistance Equipment used: None Transfers:  Sit to/from Stand Sit to Stand: Supervision         General transfer comment: moves slowly and guarded  Ambulation/Gait Ambulation/Gait assistance: Supervision;Min guard Ambulation Distance (Feet): 200 Feet Assistive device: None Gait Pattern/deviations: Step-through pattern;Decreased stride length Gait velocity: decr   General Gait Details: slow but mostly steady gait, reported dizziness after 40 feet and BP 120/86 mmHg (94/50 mmHg prior to entering pt's room) reports dizziness with turning, feels more comfortable looking downwards (neck/head)   Stairs            Wheelchair Mobility    Modified Rankin (Stroke Patients Only)       Balance                                            Cognition Arousal/Alertness: Awake/alert Behavior During Therapy: WFL for tasks assessed/performed Overall Cognitive Status: Within Functional Limits for tasks assessed                                        Exercises      General Comments        Pertinent Vitals/Pain Pain Assessment: Faces Faces Pain Scale: Hurts little more Pain Location: neck Pain Descriptors / Indicators: Tightness;Pressure Pain Intervention(s): Repositioned;Monitored during session  Home Living                      Prior Function            PT Goals (current goals can now be found in the care plan section) Progress towards PT goals: Progressing toward goals    Frequency    Min 3X/week      PT Plan Current plan remains appropriate    Co-evaluation              AM-PAC PT "6 Clicks" Daily Activity  Outcome Measure  Difficulty turning over in bed (including adjusting bedclothes, sheets and blankets)?: None Difficulty moving from lying on back to sitting on the side of the bed? : A Little Difficulty sitting down on and standing up from a chair with arms (e.g., wheelchair, bedside commode, etc,.)?: A Little Help needed moving to and from a  bed to chair (including a wheelchair)?: A Little Help needed walking in hospital room?: A Little Help needed climbing 3-5 steps with a railing? : A Little 6 Click Score: 19    End of Session   Activity Tolerance: Patient tolerated treatment well Patient left: in bed   PT Visit Diagnosis: Unsteadiness on feet (R26.81)     Time: [id number] PT Time Calculation (min) (ACUTE ONLY): 25 min  Charges:  $Physical Performance Test: 8-22 mins                    G Codes:       Zenovia JarredKati Rowena Moilanen, PT, DPT 03/07/2017 Pager: [phone]  Maida SaleLEMYRE,KATHrine E 03/07/2017, 3:23 PM

## 2017-03-07 NOTE — Discharge Summary (Signed)
Yolanda Estrada, is a 37 y.o. female  DOB 10-Sep-1980  MRN [id number].  Admission date:  03/06/2017  Admitting Physician  No admitting provider for patient encounter.  Discharge Date:  03/07/2017   Primary MD  Patient, No Pcp Per  Recommendations for primary care physician for things to follow:   Admission Diagnosis  Dizziness x 3 days, blurred vision, neck pain   Discharge Diagnosis  Dizziness x 3 days, blurred vision, neck pain    Principal Problem:   Dizziness Active Problems:   History of Graves' disease      Past Medical History:  Diagnosis Date  . Graves disease   . Hypertension   . Thyroid disease     Past Surgical History:  Procedure Laterality Date  . TUBAL LIGATION         HPI  from the history and physical done on the day of admission:   Chief Complaint: Right eye blurry vision.  HPI: Yolanda Estrada is a 37 y.o. female with history of Graves' disease has not been taking any medications presents to the ER with complaints of having blurry vision with dizziness.  Patient states her symptoms started 5 days ago after she had shouted at her kids.  Her head started feeling full and pressurized with her right eye getting blurred off and on.  Patient also at the same time started feeling dizzy on walking.  Symptoms persisted for last 5 days and came to the ER.  Denies any weakness of the upper or lower extremities or any nausea vomiting or difficulty speaking or swallowing.  ED Course: In the ER patient appears nonfocal.  Difficulty on walking due to dizziness.  CT angiogram of the head and neck was negative.  Tele neurology was consulted by the ER physician and at this time neurologist recommended to get MRI brain with and without contrast.  Patient admitted for further workup.      Hospital Course:    1)Right-sided blurry vision with vertigo/dizziness and fullness of the head -discussed  with on-call neurologist Dr. Otelia LimesLindzen.  Dr. Otelia LimesLindzen advised to get MRI brain with and without contrast as advised by the tele neurologist.  If MRI is negative will need no further neurology workup as per the neurologist.  MRI brain, CTA head and CTA neck are all without acute findings, physical therapy consult appreciated.  Neurology input as noted above appreciated, okay to discharge home  2)History of Graves' disease -TSH and free T4 are unremarkable at this time   3)Tobacco abuse -advised to quit smoking.  Discharge Condition: Stable  Follow UP     Consults obtained -neurology and physical therapy  Diet and Activity recommendation:  As advised  Discharge Instructions     Discharge Instructions    Call MD for:  difficulty breathing, headache or visual disturbances   Complete by:  As directed    Call MD for:  persistant dizziness or light-headedness   Complete by:  As directed    Call MD for:  persistant nausea and vomiting  Complete by:  As directed    Call MD for:  temperature >100.4   Complete by:  As directed    Diet general   Complete by:  As directed    Discharge instructions   Complete by:  As directed    1)Vestibular Exercise dizziness/vertigo as instructed by the physical therapist 2) take medications as prescribed 3) avoid driving or operating machinery if you take meclizine, lorazepam, hydrocodone or hydroxyzine 4) may be off work on 1/30 and 03/08/2017, okay to return to work on 03/09/2017   Increase activity slowly   Complete by:  As directed         Discharge Medications     Allergies as of 03/07/2017   No Known Allergies     Medication List    STOP taking these medications   predniSONE 20 MG tablet Commonly known as:  DELTASONE     TAKE these medications   ADULT GUMMY Chew Chew 1 each by mouth daily.   HYDROcodone-acetaminophen 5-325 MG tablet Commonly known as:  NORCO/VICODIN 1 to 2 tabs every 4 to 6 hours as needed for pain.   hydrOXYzine  25 MG tablet Commonly known as:  ATARAX/VISTARIL Take 1 tablet (25 mg total) by mouth 3 (three) times daily as needed for anxiety or nausea.   LORazepam 1 MG tablet Commonly known as:  ATIVAN Take 1 tablet (1 mg total) by mouth 3 (three) times daily as needed for anxiety.   meclizine 25 MG tablet Commonly known as:  ANTIVERT Take 1 tablet (25 mg total) by mouth 3 (three) times daily as needed for dizziness or nausea (Vertigo).   meloxicam 15 MG tablet Commonly known as:  MOBIC Take 1 tablet (15 mg total) by mouth daily. With food What changed:  additional instructions   methocarbamol 500 MG tablet Commonly known as:  ROBAXIN Take 1 tablet (500 mg total) by mouth 3 (three) times daily.       Major procedures and Radiology Reports - PLEASE review detailed and final reports for all details, in brief -   Ct Angio Head W Or Wo Contrast  Result Date: 03/07/2017 CLINICAL DATA:  37 y/o F; severe headache and base of skull radiating into posterior neck with dizziness and blurred vision in the right eye. EXAM: CT ANGIOGRAPHY HEAD AND NECK TECHNIQUE: Multidetector CT imaging of the head and neck was performed using the standard protocol during bolus administration of intravenous contrast. Multiplanar CT image reconstructions and MIPs were obtained to evaluate the vascular anatomy. Carotid stenosis measurements (when applicable) are obtained utilizing NASCET criteria, using the distal internal carotid diameter as the denominator. CONTRAST:  100 cc Isovue 370 COMPARISON:  03/02/2013 CT head FINDINGS: CT HEAD FINDINGS Brain: No evidence of acute infarction, hemorrhage, hydrocephalus, extra-axial collection or mass lesion/mass effect. Vascular: As below. Skull: Normal. Negative for fracture or focal lesion. Sinuses: Imaged portions are clear. Orbits: No acute finding. Review of the MIP images confirms the above findings CTA NECK FINDINGS Aortic arch: Not included within the field of view. Right carotid  system: No evidence of dissection, stenosis (50% or greater) or occlusion. Left carotid system: No evidence of dissection, stenosis (50% or greater) or occlusion. Vertebral arteries: Codominant. No evidence of dissection, stenosis (50% or greater) or occlusion. Skeleton: Negative. Other neck: Negative. Upper chest: Negative. Review of the MIP images confirms the above findings CTA HEAD FINDINGS Anterior circulation: No significant stenosis, proximal occlusion, aneurysm, or vascular malformation. Posterior circulation: No significant stenosis, proximal occlusion, aneurysm,  or vascular malformation. Venous sinuses: As permitted by contrast timing, patent. Anatomic variants: Right fetal PCA. Small anterior and left posterior communicating arteries. Delayed phase: No abnormal intracranial enhancement. Review of the MIP images confirms the above findings Brain: No evidence of acute infarction, hemorrhage, hydrocephalus, extra-axial collection or mass lesion/mass effect. Vascular: As below. IMPRESSION: 1. Normal noncontrast CT of the head. No abnormal enhancement of the brain. 2. Patent carotid and vertebral arteries. No dissection, aneurysm, or hemodynamically significant stenosis utilizing NASCET criteria. 3. Patent anterior and posterior intracranial circulation. No large vessel occlusion, aneurysm, or significant stenosis. Electronically Signed   By: Mitzi Hansen M.D.   On: 03/07/2017 02:14   Ct Angio Neck W And/or Wo Contrast  Result Date: 03/07/2017 CLINICAL DATA:  37 y/o F; severe headache and base of skull radiating into posterior neck with dizziness and blurred vision in the right eye. EXAM: CT ANGIOGRAPHY HEAD AND NECK TECHNIQUE: Multidetector CT imaging of the head and neck was performed using the standard protocol during bolus administration of intravenous contrast. Multiplanar CT image reconstructions and MIPs were obtained to evaluate the vascular anatomy. Carotid stenosis measurements (when  applicable) are obtained utilizing NASCET criteria, using the distal internal carotid diameter as the denominator. CONTRAST:  100 cc Isovue 370 COMPARISON:  03/02/2013 CT head FINDINGS: CT HEAD FINDINGS Brain: No evidence of acute infarction, hemorrhage, hydrocephalus, extra-axial collection or mass lesion/mass effect. Vascular: As below. Skull: Normal. Negative for fracture or focal lesion. Sinuses: Imaged portions are clear. Orbits: No acute finding. Review of the MIP images confirms the above findings CTA NECK FINDINGS Aortic arch: Not included within the field of view. Right carotid system: No evidence of dissection, stenosis (50% or greater) or occlusion. Left carotid system: No evidence of dissection, stenosis (50% or greater) or occlusion. Vertebral arteries: Codominant. No evidence of dissection, stenosis (50% or greater) or occlusion. Skeleton: Negative. Other neck: Negative. Upper chest: Negative. Review of the MIP images confirms the above findings CTA HEAD FINDINGS Anterior circulation: No significant stenosis, proximal occlusion, aneurysm, or vascular malformation. Posterior circulation: No significant stenosis, proximal occlusion, aneurysm, or vascular malformation. Venous sinuses: As permitted by contrast timing, patent. Anatomic variants: Right fetal PCA. Small anterior and left posterior communicating arteries. Delayed phase: No abnormal intracranial enhancement. Review of the MIP images confirms the above findings Brain: No evidence of acute infarction, hemorrhage, hydrocephalus, extra-axial collection or mass lesion/mass effect. Vascular: As below. IMPRESSION: 1. Normal noncontrast CT of the head. No abnormal enhancement of the brain. 2. Patent carotid and vertebral arteries. No dissection, aneurysm, or hemodynamically significant stenosis utilizing NASCET criteria. 3. Patent anterior and posterior intracranial circulation. No large vessel occlusion, aneurysm, or significant stenosis.  Electronically Signed   By: Mitzi Hansen M.D.   On: 03/07/2017 02:14   Mr Laqueta Jean And Wo Contrast  Result Date: 03/07/2017 CLINICAL DATA:  Subacute neuro deficits. Posterior neck pain followed by dizziness and blurred vision in the right eye. EXAM: MRI HEAD WITHOUT AND WITH CONTRAST TECHNIQUE: Multiplanar, multiecho pulse sequences of the brain and surrounding structures were obtained without and with intravenous contrast. CONTRAST:  14mL MULTIHANCE GADOBENATE DIMEGLUMINE 529 MG/ML IV SOLN COMPARISON:  Head CT and CTA from earlier today. FINDINGS: Brain: No acute infarction, hemorrhage, hydrocephalus, extra-axial collection or mass lesion. Partially empty sella, considered incidental in this setting. Lower ventral pons flattening on sagittal T1 weighted imaging is attributed to the basilar. No foramen magnum stenosis. No suspected brain sagging. No abnormal intracranial enhancement. Vascular: Major flow voids  and vascular enhancements are preserved. Skull and upper cervical spine: Negative for marrow lesion. Sinuses/Orbits: Negative. No explanation for right eye blurry vision. IMPRESSION: Negative brain MR. No explanation for symptoms. Electronically Signed   By: Marnee Spring M.D.   On: 03/07/2017 07:25    Micro Results   No results found for this or any previous visit (from the past 240 hour(s)).   Today   Subjective    Yolanda Estrada today has no new complaints, no headaches, no chest pains no palpitations, dizziness is better,          Patient has been seen and examined prior to discharge   Objective   Blood pressure 116/60, pulse 77, temperature 98.7 F (37.1 C), resp. rate 18, height 5\' 2"  (1.575 m), weight 70.3 kg (155 lb), last menstrual period 02/11/2017, SpO2 100 %.  No intake or output data in the 24 hours ending 03/07/17 1427  Exam Gen:- Awake  In no apparent distress  HEENT:- [place].AT,   Neck-Supple Neck,No JVD,  Lungs- mostly clear  CV- S1, S2 normal Abd-   +ve B.Sounds, Abd Soft, No tenderness,    Extremity/Skin:- Intact peripheral pulses   Neuro-no focal deficits, neuro exam is overall reassuring Psych-some anxiety concerns, overall doing okay   Data Review   CBC w Diff:  Lab Results  Component Value Date   WBC 9.2 03/07/2017   HGB 13.3 03/07/2017   HCT 39.7 03/07/2017   PLT 214 03/07/2017   LYMPHOPCT 41 03/07/2017   MONOPCT 5 03/07/2017   EOSPCT 1 03/07/2017   BASOPCT 0 03/07/2017    CMP:  Lab Results  Component Value Date   NA 138 03/07/2017   K 3.4 (L) 03/07/2017   CL 108 03/07/2017   CO2 23 03/07/2017   BUN 14 03/07/2017   CREATININE 0.87 03/07/2017  .   Total Discharge time is about 33 minutes  Shon Hale M.D on 03/07/2017 at 2:27 PM  [facility]   Office  [phone]  Voice Recognition Reubin Milan dictation system was used to create this note, attempts have been made to correct errors. Please contact the author with questions and/or clarifications.

## 2017-03-07 NOTE — Discharge Instructions (Signed)
1)Vestibular Exercise dizziness/vertigo as instructed by the physical therapist 2) take medications as prescribed 3) avoid driving or operating machinery if you take meclizine, lorazepam, hydrocodone or hydroxyzine 4)May be off work on 03/07/17 and 03/08/2017, okay to return to work on 03/09/2017   may be off work on 03/07/17 and 03/08/2017, okay to return to work on 03/09/2017

## 2017-03-07 NOTE — Evaluation (Signed)
Physical Therapy Evaluation Patient Details Name: Yolanda Estrada MRN: [id number] DOB: 1980-08-16 Today's Date: 03/07/2017   History of Present Illness  Yolanda Estrada is a 37 y.o. female with history of Graves' disease presents to the ER 03/06/17 with complaints of having blurry vision with dizziness. MRI negative per report.  Clinical Impression  The patient reports dizziness associated with rolling to the right side,  then sitting upright. No noted nystagmus during position change nor with head rotation, extension, and flexion but reports dizziness while moving head.  Patient does move slowly when ambulating,, tends to keep head down as patient reports that the head positions gives more stability to move. The patient reports right eye pain, right temporal area pain and pain deep in the neck, " Like I want it to Pop". Will reassess next visit for any signs of vestibular disturbance. Pt admitted with above diagnosis. Pt currently with functional limitations due to the deficits listed below (see PT Problem List).  Pt will benefit from skilled PT to increase their independence and safety with mobility to allow discharge to the venue listed below.         Follow Up Recommendations (TBD)    Equipment Recommendations  None recommended by PT    Recommendations for Other Services       Precautions / Restrictions Precautions Precautions: Fall      Mobility  Bed Mobility Overal bed mobility: Needs Assistance Bed Mobility: Rolling;Sidelying to Sit;Sit to Supine Rolling: Supervision Sidelying to sit: Supervision   Sit to supine: Supervision   General bed mobility comments: moves guardedly due to reports  of dizziness. supports self with UE's upon sitting up. Going back to suopine ellicits no dizziness complaint, no noted nystagmus but is  noted to not move eyes, keeps forward gaze.  Transfers Overall transfer level: Needs assistance Equipment used: None Transfers: Sit to/from Stand Sit  to Stand: Supervision         General transfer comment: moves slowly and guarded, reports dizziness. no noted nystagmus,  Ambulation/Gait Ambulation/Gait assistance: Min guard Ambulation Distance (Feet): 50 Feet(x 2) Assistive device: None;1 person hand held assist Gait Pattern/deviations: Step-through pattern;Drifts right/left Gait velocity: decr   General Gait Details: occassional HHA for stability. Moves slowly. Reports less dizziness when head is held down/neck flexed.  Stairs            Wheelchair Mobility    Modified Rankin (Stroke Patients Only)       Balance Overall balance assessment: Needs assistance Sitting-balance support: No upper extremity supported;Feet supported Sitting balance-Leahy Scale: Good     Standing balance support: During functional activity;No upper extremity supported Standing balance-Leahy Scale: Fair                               Pertinent Vitals/Pain Pain Assessment: Faces Faces Pain Scale: Hurts even more Pain Location: right eye, right temporal, right back of hed.neck- feels pressure Pain Descriptors / Indicators: Pressure Pain Intervention(s): Monitored during session    Home Living Family/patient expects to be discharged to:: Private residence Living Arrangements: Children Available Help at Discharge: Available PRN/intermittently Type of Home: Mobile home Home Access: Stairs to enter   Entrance Stairs-Number of Steps: 4 Home Layout: One level        Prior Function Level of Independence: Independent         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[employer], intake specialist     Hand Dominance  Extremity/Trunk Assessment   Upper Extremity Assessment Upper Extremity Assessment: Overall WFL for tasks assessed    Lower Extremity Assessment Lower Extremity Assessment: Overall WFL for tasks assessed    Cervical / Trunk Assessment Cervical / Trunk Assessment: Normal Cervical / Trunk Exceptions: has full ROM neck  estension and rotation  Communication      Cognition Arousal/Alertness: Awake/alert Behavior During Therapy: WFL for tasks assessed/performed Overall Cognitive Status: Within Functional Limits for tasks assessed                                        General Comments      Exercises     Assessment/Plan    PT Assessment Patient needs continued PT services  PT Problem List Decreased activity tolerance;Decreased mobility;Decreased balance;Pain       PT Treatment Interventions Gait training;Balance training;Functional mobility training    PT Goals (Current goals can be found in the Care Plan section)  Acute Rehab PT Goals Patient Stated Goal: to not be sick PT Goal Formulation: With patient Time For Goal Achievement: 03/14/17 Potential to Achieve Goals: Good    Frequency Min 3X/week   Barriers to discharge        Co-evaluation               AM-PAC PT "6 Clicks" Daily Activity  Outcome Measure Difficulty turning over in bed (including adjusting bedclothes, sheets and blankets)?: None Difficulty moving from lying on back to sitting on the side of the bed? : A Little Difficulty sitting down on and standing up from a chair with arms (e.g., wheelchair, bedside commode, etc,.)?: A Little Help needed moving to and from a bed to chair (including a wheelchair)?: A Little Help needed walking in hospital room?: A Little Help needed climbing 3-5 steps with a railing? : Total 6 Click Score: 17    End of Session   Activity Tolerance: Patient tolerated treatment well Patient left: in bed Nurse Communication: Mobility status PT Visit Diagnosis: Unsteadiness on feet (R26.81);Dizziness and giddiness (R42)    Time: [id number] PT Time Calculation (min) (ACUTE ONLY): 20 min   Charges:   PT Evaluation $PT Eval Low Complexity: 1 Low     PT G CodesBlanchard Kelch:        [name] PT [phone]   [name], [name] 03/07/2017, 8:53 AM

## 2017-03-08 LAB — HIV ANTIBODY (ROUTINE TESTING W REFLEX): HIV Screen 4th Generation wRfx: NONREACTIVE

## 2017-03-08 LAB — T3, FREE: T3 FREE: 3.7 pg/mL (ref 2.0–4.4)

## 2017-05-23 ENCOUNTER — Encounter: Payer: Self-pay | Admitting: Family Medicine

## 2017-05-23 ENCOUNTER — Ambulatory Visit (INDEPENDENT_AMBULATORY_CARE_PROVIDER_SITE_OTHER): Payer: 59 | Admitting: Family Medicine

## 2017-05-23 VITALS — BP 140/80 | HR 70 | Temp 98.3°F | Resp 14 | Ht 62.0 in | Wt 172.0 lb

## 2017-05-23 DIAGNOSIS — Z7689 Persons encountering health services in other specified circumstances: Secondary | ICD-10-CM

## 2017-05-23 DIAGNOSIS — F17200 Nicotine dependence, unspecified, uncomplicated: Secondary | ICD-10-CM | POA: Diagnosis not present

## 2017-05-23 DIAGNOSIS — E669 Obesity, unspecified: Secondary | ICD-10-CM

## 2017-05-23 DIAGNOSIS — M542 Cervicalgia: Secondary | ICD-10-CM

## 2017-05-23 DIAGNOSIS — R42 Dizziness and giddiness: Secondary | ICD-10-CM

## 2017-05-23 DIAGNOSIS — E079 Disorder of thyroid, unspecified: Secondary | ICD-10-CM

## 2017-05-23 DIAGNOSIS — I10 Essential (primary) hypertension: Secondary | ICD-10-CM

## 2017-05-23 DIAGNOSIS — F410 Panic disorder [episodic paroxysmal anxiety] without agoraphobia: Secondary | ICD-10-CM

## 2017-05-23 DIAGNOSIS — F411 Generalized anxiety disorder: Secondary | ICD-10-CM | POA: Diagnosis not present

## 2017-05-23 DIAGNOSIS — Z8639 Personal history of other endocrine, nutritional and metabolic disease: Secondary | ICD-10-CM

## 2017-05-23 DIAGNOSIS — R51 Headache: Secondary | ICD-10-CM | POA: Diagnosis not present

## 2017-05-23 DIAGNOSIS — Z6831 Body mass index (BMI) 31.0-31.9, adult: Secondary | ICD-10-CM

## 2017-05-23 DIAGNOSIS — J310 Chronic rhinitis: Secondary | ICD-10-CM

## 2017-05-23 DIAGNOSIS — R519 Headache, unspecified: Secondary | ICD-10-CM | POA: Insufficient documentation

## 2017-05-23 MED ORDER — MOMETASONE FUROATE 50 MCG/ACT NA SUSP: 2.0000 | Freq: Every day | NASAL | 12 refills | Status: AC

## 2017-05-23 MED ORDER — MECLIZINE HCL 25 MG PO TABS: 25.0000 mg | ORAL_TABLET | Freq: Three times a day (TID) | ORAL | 0 refills | Status: DC | PRN

## 2017-05-23 MED ORDER — AMLODIPINE BESYLATE 5 MG PO TABS: 5.0000 mg | ORAL_TABLET | Freq: Every day | ORAL | 3 refills | Status: DC

## 2017-05-23 MED ORDER — CETIRIZINE HCL 10 MG PO TABS: 10.0000 mg | ORAL_TABLET | Freq: Every day | ORAL | 11 refills | Status: AC

## 2017-05-23 MED ORDER — ALPRAZOLAM 0.5 MG PO TABS: 0.5000 mg | ORAL_TABLET | Freq: Two times a day (BID) | ORAL | 0 refills | Status: DC | PRN
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
## 2017-05-23 NOTE — Assessment & Plan Note (Signed)
Currently pt feels anxiety is situational and occurs with her hx of HA and vertigo, otherwise does not have persistent anxiety Currently working up and treating HA/vertigo, will give small amount of benzo for anxiety attack/panic attack Pt is following up in 2 weeks to reevaluate.   Will not continue any controlled substance for her w/o SSRI/SNRI or other tx of psychiatric illness

## 2017-05-23 NOTE — Assessment & Plan Note (Signed)
Dizziness may be BPPV, is reproducible with head movements, meclizine trial helped somewhat. Patient also having dizziness in association with headache neck pain and visual disturbances in her right eye, addressing these issues, will continue meclizine 25 mill grams 3 times daily.   Recent negative imaging, patient has no focal neurological deficits Associated right eye visual disturbances blurry vision may be secondary to vertigo and or to headache, she is going for eye exam.  Visual acuity screening here is normal May need ENT referral. We will try Epley maneuver at her follow-up appointment, she would not tolerate head and neck movement today.  If unable to tolerate in clinic may need referral to PT or ENT for Epley maneuver or eval for/treatment of possible BPPV

## 2017-05-23 NOTE — Assessment & Plan Note (Signed)
TSH, free T4 Obtain endo records Currently no Rx, and last thyroid testing WNL w/o synthroid No current concerning sx of hypothyroid
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
## 2017-05-24 ENCOUNTER — Encounter: Payer: Self-pay | Admitting: Family Medicine

## 2017-05-24 DIAGNOSIS — E079 Disorder of thyroid, unspecified: Secondary | ICD-10-CM | POA: Insufficient documentation

## 2017-05-24 LAB — COMPLETE METABOLIC PANEL WITH GFR
AG RATIO: 1.7 (calc) (ref 1.0–2.5)
ALT: 16 U/L (ref 6–29)
AST: 17 U/L (ref 10–30)
Albumin: 4 g/dL (ref 3.6–5.1)
Alkaline phosphatase (APISO): 68 U/L (ref 33–115)
BUN: 13 mg/dL (ref 7–25)
CALCIUM: 9 mg/dL (ref 8.6–10.2)
CO2: 26 mmol/L (ref 20–32)
CREATININE: 1.01 mg/dL (ref 0.50–1.10)
Chloride: 107 mmol/L (ref 98–110)
GFR, EST AFRICAN AMERICAN: 83 mL/min/{1.73_m2} (ref >=60)
GFR, EST NON AFRICAN AMERICAN: 72 mL/min/{1.73_m2} (ref >=60)
GLOBULIN: 2.4 g/dL (ref 1.9–3.7)
Glucose, Bld: 81 mg/dL (ref 65–99)
POTASSIUM: 4.4 mmol/L (ref 3.5–5.3)
SODIUM: 139 mmol/L (ref 135–146)
TOTAL PROTEIN: 6.4 g/dL (ref 6.1–8.1)
Total Bilirubin: 0.5 mg/dL (ref 0.2–1.2)

## 2017-05-24 LAB — CBC WITH DIFFERENTIAL/PLATELET
BASOS ABS: 28 {cells}/uL (ref 0–200)
BASOS PCT: 0.4 %
EOS ABS: 77 {cells}/uL (ref 15–500)
EOS PCT: 1.1 %
HEMATOCRIT: 40 % (ref 35.0–45.0)
HEMOGLOBIN: 13.3 g/dL (ref 11.7–15.5)
Lymphs Abs: 2975 cells/uL (ref 850–3900)
MCH: 27.3 pg (ref 27.0–33.0)
MCHC: 33.3 g/dL (ref 32.0–36.0)
MCV: 82.1 fL (ref 80.0–100.0)
MONOS PCT: 6.1 %
MPV: 9.8 fL (ref 7.5–12.5)
NEUTROS ABS: 3493 {cells}/uL (ref 1500–7800)
Neutrophils Relative %: 49.9 %
Platelets: 248 10*3/uL (ref 140–400)
RBC: 4.87 10*6/uL (ref 3.80–5.10)
RDW: 13.3 % (ref 11.0–15.0)
TOTAL LYMPHOCYTE: 42.5 %
WBC mixed population: 427 cells/uL (ref 200–950)
WBC: 7 10*3/uL (ref 3.8–10.8)

## 2017-05-24 LAB — LIPID PANEL
CHOLESTEROL: 126 mg/dL (ref <200)
HDL: 47 mg/dL — AB (ref >50)
LDL Cholesterol (Calc): 63 mg/dL (calc)
Non-HDL Cholesterol (Calc): 79 mg/dL (calc) (ref <130)
Total CHOL/HDL Ratio: 2.7 (calc) (ref <5.0)
Triglycerides: 79 mg/dL (ref <150)

## 2017-05-24 LAB — TSH: TSH: 0.71 mIU/L

## 2017-05-24 LAB — T4, FREE: Free T4: 1.2 ng/dL (ref 0.8–1.8)

## 2017-05-24 NOTE — Patient Instructions (Addendum)
Cervical Strain and Sprain Rehab Ask your health care provider which exercises are safe for you. Do exercises exactly as told by your health care provider and adjust them as directed. It is normal to feel mild stretching, pulling, tightness, or discomfort as you do these exercises, but you should stop right away if you feel sudden pain or your pain gets worse.Do not begin these exercises until told by your health care provider. Stretching and range of motion exercises These exercises warm up your muscles and joints and improve the movement and flexibility of your neck. These exercises also help to relieve pain, numbness, and tingling. Exercise A: Cervical side bend  1. Using good posture, sit on a stable chair or stand up. 2. Without moving your shoulders, slowly tilt your left / right ear to your shoulder until you feel a stretch in your neck muscles. You should be looking straight ahead. 3. Hold for __________ seconds. 4. Repeat with the other side of your neck. Repeat __________ times. Complete this exercise __________ times a day. Exercise B: Cervical rotation  1. Using good posture, sit on a stable chair or stand up. 2. Slowly turn your head to the side as if you are looking over your left / right shoulder. ? Keep your eyes level with the ground. ? Stop when you feel a stretch along the side and the back of your neck. 3. Hold for __________ seconds. 4. Repeat this by turning to your other side. Repeat __________ times. Complete this exercise __________ times a day. Exercise C: Thoracic extension and pectoral stretch 1. Roll a towel or a small blanket so it is about 4 inches (10 cm) in diameter. 2. Lie down on your back on a firm surface. 3. Put the towel lengthwise, under your spine in the middle of your back. It should not be not under your shoulder blades. The towel should line up with your spine from your middle back to your lower back. 4. Put your hands behind your head and let your  elbows fall out to your sides. 5. Hold for __________ seconds. Repeat __________ times. Complete this exercise __________ times a day. Strengthening exercises These exercises build strength and endurance in your neck. Endurance is the ability to use your muscles for a long time, even after your muscles get tired. Exercise D: Upper cervical flexion, isometric 1. Lie on your back with a thin pillow behind your head and a small rolled-up towel under your neck. 2. Gently tuck your chin toward your chest and nod your head down to look toward your feet. Do not lift your head off the pillow. 3. Hold for __________ seconds. 4. Release the tension slowly. Relax your neck muscles completely before you repeat this exercise. Repeat __________ times. Complete this exercise __________ times a day. Exercise E: Cervical extension, isometric  1. Stand about 6 inches (15 cm) away from a wall, with your back facing the wall. 2. Place a soft object, about 6-8 inches (15-20 cm) in diameter, between the back of your head and the wall. A soft object could be a small pillow, a ball, or a folded towel. 3. Gently tilt your head back and press into the soft object. Keep your jaw and forehead relaxed. 4. Hold for __________ seconds. 5. Release the tension slowly. Relax your neck muscles completely before you repeat this exercise. Repeat __________ times. Complete this exercise __________ times a day. Posture and body mechanics  Body mechanics refers to the movements and positions of   your body while you do your daily activities. Posture is part of body mechanics. Good posture and healthy body mechanics can help to relieve stress in your body's tissues and joints. Good posture means that your spine is in its natural S-curve position (your spine is neutral), your shoulders are pulled back slightly, and your head is not tipped forward. The following are general guidelines for applying improved posture and body mechanics to  your everyday activities. Standing  When standing, keep your spine neutral and keep your feet about hip-width apart. Keep a slight bend in your knees. Your ears, shoulders, and hips should line up.  When you do a task in which you stand in one place for a long time, place one foot up on a stable object that is 2-4 inches (5-10 cm) high, such as a footstool. This helps keep your spine neutral. Sitting   When sitting, keep your spine neutral and your keep feet flat on the floor. Use a footrest, if necessary, and keep your thighs parallel to the floor. Avoid rounding your shoulders, and avoid tilting your head forward.  When working at a desk or a computer, keep your desk at a height where your hands are slightly lower than your elbows. Slide your chair under your desk so you are close enough to maintain good posture.  When working at a computer, place your monitor at a height where you are looking straight ahead and you do not have to tilt your head forward or downward to look at the screen. Resting When lying down and resting, avoid positions that are most painful for you. Try to support your neck in a neutral position. You can use a contour pillow or a small rolled-up towel. Your pillow should support your neck but not push on it. This information is not intended to replace advice given to you by your health care provider. Make sure you discuss any questions you have with your health care provider. Document Released: 01/23/2005 Document Revised: 09/30/2015 Document Reviewed: 12/30/2014 Elsevier Interactive Patient Education  2018 [employer]    [employer] Therapy Heat therapy can help ease sore, stiff, injured, and tight muscles and joints. Heat relaxes your muscles, which may help ease your pain. What are the risks? If you have any of the following conditions, do not use heat therapy unless your health care provider has approved:  Poor circulation.  Healing wounds or scarred skin in the  area being treated.  Diabetes, heart disease, or high blood pressure.  Not being able to feel (numbness) the area being treated.  Unusual swelling of the area being treated.  Active infections.  Blood clots.  Cancer.  Inability to communicate pain. This may include young children and people who have problems with their brain function (dementia).  Pregnancy.  Heat therapy should only be used on old, pre-existing, or long-lasting (chronic) injuries. Do not use heat therapy on new injuries unless directed by your health care provider. How to use heat therapy There are several different kinds of heat therapy, including:  Moist heat pack.  Warm water bath.  Hot water bottle.  Electric heating pad.  Heated gel pack.  Heated wrap.  Electric heating pad.  Use the heat therapy method suggested by your health care provider. Follow your health care provider's instructions on when and how to use heat therapy. General heat therapy recommendations  Do not sleep while using heat therapy. Only use heat therapy while you are awake.  Your skin may turn pink  while using heat therapy. Do not use heat therapy if your skin turns red.  Do not use heat therapy if you have new pain.  High heat or long exposure to heat can cause burns. Be careful when using heat therapy to avoid burning your skin.  Do not use heat therapy on areas of your skin that are already irritated, such as with a rash or sunburn. Contact a health care provider if:  You have blisters, redness, swelling, or numbness.  You have new pain.  Your pain is worse. This information is not intended to replace advice given to you by your health care provider. Make sure you discuss any questions you have with your health care provider. Document Released: 04/17/2011 Document Revised: 07/01/2015 Document Reviewed: 03/18/2013 Elsevier Interactive Patient Education  2018 [employer].   Benign Positional Vertigo Vertigo is  the feeling that you or your surroundings are moving when they are not. Benign positional vertigo is the most common form of vertigo. The cause of this condition is not serious (is benign). This condition is triggered by certain movements and positions (is positional). This condition can be dangerous if it occurs while you are doing something that could endanger you or others, such as driving. What are the causes? In many cases, the cause of this condition is not known. It may be caused by a disturbance in an area of the inner ear that helps your brain to sense movement and balance. This disturbance can be caused by a viral infection (labyrinthitis), head injury, or repetitive motion. What increases the risk? This condition is more likely to develop in:  Women.  People who are 62 years of age or older.  What are the signs or symptoms? Symptoms of this condition usually happen when you move your head or your eyes in different directions. Symptoms may start suddenly, and they usually last for less than a minute. Symptoms may include:  Loss of balance and falling.  Feeling like you are spinning or moving.  Feeling like your surroundings are spinning or moving.  Nausea and vomiting.  Blurred vision.  Dizziness.  Involuntary eye movement (nystagmus).  Symptoms can be mild and cause only slight annoyance, or they can be severe and interfere with daily life. Episodes of benign positional vertigo may return (recur) over time, and they may be triggered by certain movements. Symptoms may improve over time. How is this diagnosed? This condition is usually diagnosed by medical history and a physical exam of the head, neck, and ears. You may be referred to a health care provider who specializes in ear, nose, and throat (ENT) problems (otolaryngologist) or a provider who specializes in disorders of the nervous system (neurologist). You may have additional testing, including:  MRI.  A CT  scan.  Eye movement tests. Your health care provider may ask you to change positions quickly while he or she watches you for symptoms of benign positional vertigo, such as nystagmus. Eye movement may be tested with an electronystagmogram (ENG), caloric stimulation, the Dix-Hallpike test, or the roll test.  An electroencephalogram (EEG). This records electrical activity in your brain.  Hearing tests.  How is this treated? Usually, your health care provider will treat this by moving your head in specific positions to adjust your inner ear back to normal. Surgery may be needed in severe cases, but this is rare. In some cases, benign positional vertigo may resolve on its own in 2-4 weeks. Follow these instructions at home: Safety  Move slowly.Avoid  sudden body or head movements.  Avoid driving.  Avoid operating heavy machinery.  Avoid doing any tasks that would be dangerous to you or others if a vertigo episode would occur.  If you have trouble walking or keeping your balance, try using a cane for stability. If you feel dizzy or unstable, sit down right away.  Return to your normal activities as told by your health care provider. Ask your health care provider what activities are safe for you. General instructions  Take over-the-counter and prescription medicines only as told by your health care provider.  Avoid certain positions or movements as told by your health care provider.  Drink enough fluid to keep your urine clear or pale yellow.  Keep all follow-up visits as told by your health care provider. This is important. Contact a health care provider if:  You have a fever.  Your condition gets worse or you develop new symptoms.  Your family or friends notice any behavioral changes.  Your nausea or vomiting gets worse.  You have numbness or a "pins and needles" sensation. Get help right away if:  You have difficulty speaking or moving.  You are always dizzy.  You  faint.  You develop severe headaches.  You have weakness in your legs or arms.  You have changes in your hearing or vision.  You develop a stiff neck.  You develop sensitivity to light. This information is not intended to replace advice given to you by your health care provider. Make sure you discuss any questions you have with your health care provider. Document Released: 10/31/2005 Document Revised: 07/01/2015 Document Reviewed: 05/18/2014 Elsevier Interactive Patient Education  2018 [employer].   Vertigo Vertigo is the feeling that you or your surroundings are moving when they are not. Vertigo can be dangerous if it occurs while you are doing something that could endanger you or others, such as driving. What are the causes? This condition is caused by a disturbance in the signals that are sent by your body's sensory systems to your brain. Different causes of a disturbance can lead to vertigo, including:  Infections, especially in the inner ear.  A bad reaction to a drug, or misuse of alcohol and medicines.  Withdrawal from drugs or alcohol.  Quickly changing positions, as when lying down or rolling over in bed.  Migraine headaches.  Decreased blood flow to the brain.  Decreased blood pressure.  Increased pressure in the brain from a head or neck injury, stroke, infection, tumor, or bleeding.  Central nervous system disorders.  What are the signs or symptoms? Symptoms of this condition usually occur when you move your head or your eyes in different directions. Symptoms may start suddenly, and they usually last for less than a minute. Symptoms may include:  Loss of balance and falling.  Feeling like you are spinning or moving.  Feeling like your surroundings are spinning or moving.  Nausea and vomiting.  Blurred vision or double vision.  Difficulty hearing.  Slurred speech.  Dizziness.  Involuntary eye movement (nystagmus).  Symptoms can be mild and  cause only slight annoyance, or they can be severe and interfere with daily life. Episodes of vertigo may return (recur) over time, and they are often triggered by certain movements. Symptoms may improve over time. How is this diagnosed? This condition may be diagnosed based on medical history and the quality of your nystagmus. Your health care provider may test your eye movements by asking you to quickly change positions to  trigger the nystagmus. This may be called the Dix-Hallpike test, head thrust test, or roll test. You may be referred to a health care provider who specializes in ear, nose, and throat (ENT) problems (otolaryngologist) or a provider who specializes in disorders of the central nervous system (neurologist). You may have additional testing, including:  A physical exam.  Blood tests.  MRI.  A CT scan.  An electrocardiogram (ECG). This records electrical activity in your heart.  An electroencephalogram (EEG). This records electrical activity in your brain.  Hearing tests.  How is this treated? Treatment for this condition depends on the cause and the severity of the symptoms. Treatment options include:  Medicines to treat nausea or vertigo. These are usually used for severe cases. Some medicines that are used to treat other conditions may also reduce or eliminate vertigo symptoms. These include: ? Medicines that control allergies (antihistamines). ? Medicines that control seizures (anticonvulsants). ? Medicines that relieve depression (antidepressants). ? Medicines that relieve anxiety (sedatives).  Head movements to adjust your inner ear back to normal. If your vertigo is caused by an ear problem, your health care provider may recommend certain movements to correct the problem.  Surgery. This is rare.  Follow these instructions at home: Safety  Move slowly.Avoid sudden body or head movements.  Avoid driving.  Avoid operating heavy machinery.  Avoid doing any  tasks that would cause danger to you or others if you would have a vertigo episode during the task.  If you have trouble walking or keeping your balance, try using a cane for stability. If you feel dizzy or unstable, sit down right away.  Return to your normal activities as told by your health care provider. Ask your health care provider what activities are safe for you. General instructions  Take over-the-counter and prescription medicines only as told by your health care provider.  Avoid certain positions or movements as told by your health care provider.  Drink enough fluid to keep your urine clear or pale yellow.  Keep all follow-up visits as told by your health care provider. This is important. Contact a health care provider if:  Your medicines do not relieve your vertigo or they make it worse.  You have a fever.  Your condition gets worse or you develop new symptoms.  Your family or friends notice any behavioral changes.  Your nausea or vomiting gets worse.  You have numbness or a "pins and needles" sensation in part of your body. Get help right away if:  You have difficulty moving or speaking.  You are always dizzy.  You faint.  You develop severe headaches.  You have weakness in your hands, arms, or legs.  You have changes in your hearing or vision.  You develop a stiff neck.  You develop sensitivity to light. This information is not intended to replace advice given to you by your health care provider. Make sure you discuss any questions you have with your health care provider. Document Released: 11/02/2004 Document Revised: 07/07/2015 Document Reviewed: 05/18/2014 Elsevier Interactive Patient Education  2018 [employer] Inc.    General Headache Without Cause A headache is pain or discomfort felt around the head or neck area. There are many causes and types of headaches. In some cases, the cause may not be found. Follow these instructions at home: Managing  pain  Take over-the-counter and prescription medicines only as told by your doctor.  Lie down in a dark, quiet room when you have a headache.  If directed,  apply ice to the head and neck area: ? Put ice in a plastic bag. ? Place a towel between your skin and the bag. ? Leave the ice on for 20 minutes, 2-3 times per day.  Use a heating pad or hot shower to apply heat to the head and neck area as told by your doctor.  Keep lights dim if bright lights bother you or make your headaches worse. Eating and drinking  Eat meals on a regular schedule.  Lessen how much alcohol you drink.  Lessen how much caffeine you drink, or stop drinking caffeine. General instructions  Keep all follow-up visits as told by your doctor. This is important.  Keep a journal to find out if certain things bring on headaches. For example, write down: ? What you eat and drink. ? How much sleep you get. ? Any change to your diet or medicines.  Relax by getting a massage or doing other relaxing activities.  Lessen stress.  Sit up straight. Do not tighten (tense) your muscles.  Do not use tobacco products. This includes cigarettes, chewing tobacco, or e-cigarettes. If you need help quitting, ask your doctor.  Exercise regularly as told by your doctor.  Get enough sleep. This often means 7-9 hours of sleep. Contact a doctor if:  Your symptoms are not helped by medicine.  You have a headache that feels different than the other headaches.  You feel sick to your stomach (nauseous) or you throw up (vomit).  You have a fever. Get help right away if:  Your headache becomes really bad.  You keep throwing up.  You have a stiff neck.  You have trouble seeing.  You have trouble speaking.  You have pain in the eye or ear.  Your muscles are weak or you lose muscle control.  You lose your balance or have trouble walking.  You feel like you will pass out (faint) or you pass out.  You have  confusion. This information is not intended to replace advice given to you by your health care provider. Make sure you discuss any questions you have with your health care provider. Document Released: 11/02/2007 Document Revised: 07/01/2015 Document Reviewed: 05/18/2014 Elsevier Interactive Patient Education  2018 [employer]   Managing Your Hypertension Hypertension is commonly called high blood pressure. This is when the force of your blood pressing against the walls of your arteries is too strong. Arteries are blood vessels that carry blood from your heart throughout your body. Hypertension forces the heart to work harder to pump blood, and may cause the arteries to become narrow or stiff. Having untreated or uncontrolled hypertension can cause heart attack, stroke, kidney disease, and other problems. What are blood pressure readings? A blood pressure reading consists of a higher number over a lower number. Ideally, your blood pressure should be below 120/80. The first ("top") number is called the systolic pressure. It is a measure of the pressure in your arteries as your heart beats. The second ("bottom") number is called the diastolic pressure. It is a measure of the pressure in your arteries as the heart relaxes. What does my blood pressure reading mean? Blood pressure is classified into four stages. Based on your blood pressure reading, your health care provider may use the following stages to determine what type of treatment you need, if any. Systolic pressure and diastolic pressure are measured in a unit called mm Hg. Normal  Systolic pressure: below 120.  Diastolic pressure: below 80. Elevated  Systolic pressure:  120-129.  Diastolic pressure: below 80. Hypertension stage 1  Systolic pressure: 130-139.  Diastolic pressure: 80-89. Hypertension stage 2  Systolic pressure: 140 or above.  Diastolic pressure: 90 or above. What health risks are associated with  hypertension? Managing your hypertension is an important responsibility. Uncontrolled hypertension can lead to:  A heart attack.  A stroke.  A weakened blood vessel (aneurysm).  Heart failure.  Kidney damage.  Eye damage.  Metabolic syndrome.  Memory and concentration problems.  What changes can I make to manage my hypertension? Hypertension can be managed by making lifestyle changes and possibly by taking medicines. Your health care provider will help you make a plan to bring your blood pressure within a normal range. Eating and drinking  Eat a diet that is high in fiber and potassium, and low in salt (sodium), added sugar, and fat. An example eating plan is called the DASH (Dietary Approaches to Stop Hypertension) diet. To eat this way: ? Eat plenty of fresh fruits and vegetables. Try to fill half of your plate at each meal with fruits and vegetables. ? Eat whole grains, such as whole wheat pasta, brown rice, or whole grain bread. Fill about one quarter of your plate with whole grains. ? Eat low-fat diary products. ? Avoid fatty cuts of meat, processed or cured meats, and poultry with skin. Fill about one quarter of your plate with lean proteins such as fish, chicken without skin, beans, eggs, and tofu. ? Avoid premade and processed foods. These tend to be higher in sodium, added sugar, and fat.  Reduce your daily sodium intake. Most people with hypertension should eat less than 1,500 mg of sodium a day.  Limit alcohol intake to no more than 1 drink a day for nonpregnant women and 2 drinks a day for men. One drink equals 12 oz of beer, 5 oz of wine, or 1 oz of hard liquor. Lifestyle  Work with your health care provider to maintain a healthy body weight, or to lose weight. Ask what an ideal weight is for you.  Get at least 30 minutes of exercise that causes your heart to beat faster (aerobic exercise) most days of the week. Activities may include walking, swimming, or  biking.  Include exercise to strengthen your muscles (resistance exercise), such as weight lifting, as part of your weekly exercise routine. Try to do these types of exercises for 30 minutes at least 3 days a week.  Do not use any products that contain nicotine or tobacco, such as cigarettes and e-cigarettes. If you need help quitting, ask your health care provider.  Control any long-term (chronic) conditions you have, such as high cholesterol or diabetes. Monitoring  Monitor your blood pressure at home as told by your health care provider. Your personal target blood pressure may vary depending on your medical conditions, your age, and other factors.  Have your blood pressure checked regularly, as often as told by your health care provider. Working with your health care provider  Review all the medicines you take with your health care provider because there may be side effects or interactions.  Talk with your health care provider about your diet, exercise habits, and other lifestyle factors that may be contributing to hypertension.  Visit your health care provider regularly. Your health care provider can help you create and adjust your plan for managing hypertension. Will I need medicine to control my blood pressure? Your health care provider may prescribe medicine if lifestyle changes are not enough to  get your blood pressure under control, and if:  Your systolic blood pressure is 130 or higher.  Your diastolic blood pressure is 80 or higher.  Take medicines only as told by your health care provider. Follow the directions carefully. Blood pressure medicines must be taken as prescribed. The medicine does not work as well when you skip doses. Skipping doses also puts you at risk for problems. Contact a health care provider if:  You think you are having a reaction to medicines you have taken.  You have repeated (recurrent) headaches.  You feel dizzy.  You have swelling in your  ankles.  You have trouble with your vision. Get help right away if:  You develop a severe headache or confusion.  You have unusual weakness or numbness, or you feel faint.  You have severe pain in your chest or abdomen.  You vomit repeatedly.  You have trouble breathing. Summary  Hypertension is when the force of blood pumping through your arteries is too strong. If this condition is not controlled, it may put you at risk for serious complications.  Your personal target blood pressure may vary depending on your medical conditions, your age, and other factors. For most people, a normal blood pressure is less than 120/80.  Hypertension is managed by lifestyle changes, medicines, or both. Lifestyle changes include weight loss, eating a healthy, low-sodium diet, exercising more, and limiting alcohol. This information is not intended to replace advice given to you by your health care provider. Make sure you discuss any questions you have with your health care provider. Document Released: 10/18/2011 Document Revised: 12/22/2015 Document Reviewed: 12/22/2015 Elsevier Interactive Patient Education  2018 [employer].   Steps to Quit Smoking Smoking tobacco can be bad for your health. It can also affect almost every organ in your body. Smoking puts you and people around you at risk for many serious long-lasting (chronic) diseases. Quitting smoking is hard, but it is one of the best things that you can do for your health. It is never too late to quit. What are the benefits of quitting smoking? When you quit smoking, you lower your risk for getting serious diseases and conditions. They can include:  Lung cancer or lung disease.  Heart disease.  Stroke.  Heart attack.  Not being able to have children (infertility).  Weak bones (osteoporosis) and broken bones (fractures).  If you have coughing, wheezing, and shortness of breath, those symptoms may get better when you quit. You may also  get sick less often. If you are pregnant, quitting smoking can help to lower your chances of having a baby of low birth weight. What can I do to help me quit smoking? Talk with your doctor about what can help you quit smoking. Some things you can do (strategies) include:  Quitting smoking totally, instead of slowly cutting back how much you smoke over a period of time.  Going to in-person counseling. You are more likely to quit if you go to many counseling sessions.  Using resources and support systems, such as: ? Agricultural engineer with a Veterinary surgeon. ? Phone quitlines. ? Automotive engineer. ? Support groups or group counseling. ? Text messaging programs. ? Mobile phone apps or applications.  Taking medicines. Some of these medicines may have nicotine in them. If you are pregnant or breastfeeding, do not take any medicines to quit smoking unless your doctor says it is okay. Talk with your doctor about counseling or other things that can help you.  Talk  with your doctor about using more than one strategy at the same time, such as taking medicines while you are also going to in-person counseling. This can help make quitting easier. What things can I do to make it easier to quit? Quitting smoking might feel very hard at first, but there is a lot that you can do to make it easier. Take these steps:  Talk to your family and friends. Ask them to support and encourage you.  Call phone quitlines, reach out to support groups, or work with a Veterinary surgeon.  Ask people who smoke to not smoke around you.  Avoid places that make you want (trigger) to smoke, such as: ? Bars. ? Parties. ? Smoke-break areas at work.  Spend time with people who do not smoke.  Lower the stress in your life. Stress can make you want to smoke. Try these things to help your stress: ? Getting regular exercise. ? Deep-breathing exercises. ? Yoga. ? Meditating. ? Doing a body scan. To do this, close your eyes, focus on  one area of your body at a time from head to toe, and notice which parts of your body are tense. Try to relax the muscles in those areas.  Download or buy apps on your mobile phone or tablet that can help you stick to your quit plan. There are many free apps, such as QuitGuide from the [employer] developer for Disease Control and Prevention). You can find more support from smokefree.gov and other websites.  This information is not intended to replace advice given to you by your health care provider. Make sure you discuss any questions you have with your health care provider. Document Released: 11/19/2008 Document Revised: 09/21/2015 Document Reviewed: 06/09/2014 Elsevier Interactive Patient Education  2018 [employer].

## 2017-05-24 NOTE — Progress Notes (Signed)
Lab work looks great.  Thyroid, cell counts, kidney function are all great.  The only abnormal lab is HDL (high density lipids) which is just below normal/optimal range.  This lipid is protective for your heart health.  HDL's improve with exercise, stopping smoking, losing weight, healthy diet, and supplementing with fish oil and/or omega-3 fatty acids.

## 2017-05-28 NOTE — Telephone Encounter (Signed)
-----   Message from Danelle BerryLeisa Ronny Ruddell, [place]-C sent at 05/23/2017  2:02 PM EDT ----- Regarding: records request F/up endo and obgyn

## 2017-06-01 NOTE — Telephone Encounter (Signed)
Spoke with patient and patient stated that she was able to pick of the remainder of the medications prescribed on yesterday. PMP was printed so that you may review it.

## 2017-06-06 ENCOUNTER — Ambulatory Visit: Payer: 59 | Admitting: Family Medicine

## 2017-06-15 ENCOUNTER — Other Ambulatory Visit: Payer: Self-pay

## 2017-06-15 ENCOUNTER — Encounter: Payer: Self-pay | Admitting: Family Medicine

## 2017-06-15 ENCOUNTER — Ambulatory Visit (INDEPENDENT_AMBULATORY_CARE_PROVIDER_SITE_OTHER): Payer: 59 | Admitting: Family Medicine

## 2017-06-15 VITALS — BP 138/90 | HR 92 | Temp 98.3°F | Wt 174.1 lb

## 2017-06-15 DIAGNOSIS — I10 Essential (primary) hypertension: Secondary | ICD-10-CM

## 2017-06-15 DIAGNOSIS — H538 Other visual disturbances: Secondary | ICD-10-CM

## 2017-06-15 DIAGNOSIS — J310 Chronic rhinitis: Secondary | ICD-10-CM

## 2017-06-15 DIAGNOSIS — R51 Headache: Secondary | ICD-10-CM | POA: Diagnosis not present

## 2017-06-15 DIAGNOSIS — F410 Panic disorder [episodic paroxysmal anxiety] without agoraphobia: Secondary | ICD-10-CM | POA: Diagnosis not present

## 2017-06-15 DIAGNOSIS — R42 Dizziness and giddiness: Secondary | ICD-10-CM

## 2017-06-15 DIAGNOSIS — J329 Chronic sinusitis, unspecified: Secondary | ICD-10-CM

## 2017-06-15 DIAGNOSIS — R519 Headache, unspecified: Secondary | ICD-10-CM

## 2017-06-15 DIAGNOSIS — F411 Generalized anxiety disorder: Secondary | ICD-10-CM | POA: Diagnosis not present

## 2017-06-15 MED ORDER — AMLODIPINE BESYLATE 10 MG PO TABS: 5.0000 mg | ORAL_TABLET | Freq: Every day | ORAL | 2 refills | Status: DC

## 2017-06-15 MED ORDER — DIAZEPAM 5 MG PO TABS: 5.0000 mg | ORAL_TABLET | Freq: Two times a day (BID) | ORAL | 0 refills | Status: DC | PRN

## 2017-06-15 MED ORDER — MONTELUKAST SODIUM 10 MG PO TABS: 10.0000 mg | ORAL_TABLET | Freq: Every day | ORAL | 3 refills | Status: AC

## 2017-06-15 NOTE — Assessment & Plan Note (Signed)
Continuous vertigo since 03/06/17 Unclear etiology, may be multifactorial - HA, neck pain, allergies, BPPV, HTN, anxiety, visual disturbances No improvement with tx of rhinosinusitis (allergic), HTN, meclizine trial D/c meclizine, xanax, Rx valium to cover anxiety, vertigo, muscle spasms to neck Neurology referral

## 2017-06-15 NOTE — Assessment & Plan Note (Signed)
Patient continues to have headaches, improved somewhat with treatment of sinuses, but she still continues to have neck pain and tightness.  Blood pressure is minimally improved increasing her blood pressure did dose. Pain continues to be described as a pressure in the back of her head of the base of her skull where it connects to her neck. Referral to neurology to further evaluate
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
## 2017-06-15 NOTE — Patient Instructions (Signed)
Keep a blood pressure log and call me in 2 weeks if still over 140/90

## 2017-06-15 NOTE — Progress Notes (Signed)
n

## 2017-06-15 NOTE — Assessment & Plan Note (Addendum)
Patient does not believe anxiety is negatively affecting her life on a daily or continuous basis but she does continue to have episodes of excessive worry and anxiety which is causing worse dizziness.  She states she has only been taking the low-dose Xanax every once in a while, usually at night to help her get to sleep.  She is to discontinue Xanax because we are going to use Valium for muscle spasms and vertigo, this will be longer acting but will still help with anxiety. Discussed if she has continued episodes of acute anxiety attacks that we will need to start a daily medicine like SNRI Patient overall looks well and seems more positive than last several visits.  She is optimistic about her referrals to neurology and ophthalmology.

## 2017-06-20 DIAGNOSIS — I10 Essential (primary) hypertension: Secondary | ICD-10-CM | POA: Insufficient documentation

## 2017-06-20 NOTE — Assessment & Plan Note (Addendum)
Goal 130/80, BP improving but not at goal today 138/90 No SE Increase Norvasc from 5 to 10 mg daily PT to keep BP log and return to clinic in 1-2 weeks, to f/up in 2 w if above 140/90 on average

## 2017-06-28 ENCOUNTER — Ambulatory Visit (INDEPENDENT_AMBULATORY_CARE_PROVIDER_SITE_OTHER): Payer: 59 | Admitting: Neurology

## 2017-06-28 ENCOUNTER — Encounter: Payer: Self-pay | Admitting: Neurology

## 2017-06-28 VITALS — BP 128/97 | HR 80 | Ht 62.0 in | Wt 171.0 lb

## 2017-06-28 DIAGNOSIS — G43709 Chronic migraine without aura, not intractable, without status migrainosus: Secondary | ICD-10-CM | POA: Diagnosis not present

## 2017-06-28 DIAGNOSIS — R519 Headache, unspecified: Secondary | ICD-10-CM

## 2017-06-28 DIAGNOSIS — IMO0002 Reserved for concepts with insufficient information to code with codable children: Secondary | ICD-10-CM

## 2017-06-28 DIAGNOSIS — R51 Headache: Secondary | ICD-10-CM | POA: Diagnosis not present

## 2017-06-28 MED ORDER — TIZANIDINE HCL 4 MG PO TABS: 4.0000 mg | ORAL_TABLET | Freq: Four times a day (QID) | ORAL | 3 refills | Status: AC | PRN

## 2017-06-28 MED ORDER — NORTRIPTYLINE HCL 10 MG PO CAPS: 10.0000 mg | ORAL_CAPSULE | Freq: Every day | ORAL | 3 refills | Status: DC

## 2017-06-28 MED ORDER — SUMATRIPTAN SUCCINATE 50 MG PO TABS: 50.0000 mg | ORAL_TABLET | ORAL | 5 refills | Status: AC | PRN

## 2017-06-28 NOTE — Progress Notes (Signed)
PATIENT: Yolanda Estrada DOB: December 29, 1980  Chief Complaint  Patient presents with  . Neck Pain/Pressure    On 05/25/17, she was very upset with her daughter and they were having a heated discussion about getting a dog.  States she had a sudden, severe pain in the back of her neck and dizzness.  She was evaluated in the ED for this event. These symptoms continue to be a problem for her.  She also has right eye pressure and pain that has been present for at least one year.  She has a pending appt with ophthalmology on 07/24/17 (Dr. Dione Booze).  . PCP    Danelle Berry, PA-C     HISTORICAL  Yolanda Estrada is a 37 year old female, seen in refer by her primary care PA Danelle Berry, for evaluation of headache, neck pain  She had a history of hypertension, chronic migraine in the past,  On March 01, 2017, she got into heart block and with her daughter, had instant severe right neck pain, radiating pain to the right side of her skull, right behind eye pain, which has been persistent since onset,  She actually presented to the emergency room on March 07, 2017, because of constant brain foggy unsteady sensation, right behind eye pressure, headaches, right-sided neck pain,  She had extensive evaluations I personally reviewed MRI of the brain on March 07, 2017 that was normal, normal CT head without contrast, CT angiogram of head and neck.  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  Previous history of migraine, happening occasionally, usually triggered by weather change, stress, sleep deprivation, dehydration, exertion,    REVIEW OF SYSTEMS: Full 14 system review of systems performed and notable only for fatigue, palpitation, spinning sensation, blurred vision, double vision, eye pain, incontinence, joint pain, allergies, memory loss, confusion, headaches, dizziness, anxiety, not enough sleep, decreased energy  ALLERGIES: No Known Allergies  HOME MEDICATIONS: Current Outpatient Medications  Medication Sig Dispense Refill  . amLODipine (NORVASC) 10 MG tablet Take 0.5 tablets (5 mg total) by mouth daily. 90 tablet 2  . cetirizine (ZYRTEC) 10 MG tablet Take 1 tablet (10 mg total) by mouth daily. 30 tablet 11  . diazepam (VALIUM) 5 MG tablet Take 1 tablet (5 mg total) by mouth every 12 (twelve) hours as needed for anxiety or muscle spasms (vertigo). 20 tablet 0  . mometasone (NASONEX) 50 MCG/ACT nasal spray Place 2 sprays into the nose daily. 17 g 12  . montelukast (SINGULAIR) 10 MG tablet Take 1 tablet (10 mg total) by mouth at bedtime. 30 tablet 3  . Multiple Vitamins-Minerals (ADULT GUMMY) CHEW Chew 1 each by mouth daily.     No current facility-administered medications for this visit.     PAST MEDICAL HISTORY: Past Medical History:  Diagnosis Date  . Anxiety   . Depression   . Graves disease   . Hypertension   .  Thyroid disease     PAST SURGICAL HISTORY: Past Surgical History:  Procedure Laterality Date  . TUBAL LIGATION      FAMILY HISTORY: Family History  Problem Relation Age of Onset  . Rheum arthritis Mother   . Asthma Mother   . Thyroid disease Mother   . Asthma Father   . Cancer Father        prostate  . Heart disease Father   . Heart disease Maternal Grandmother   . Diabetes Maternal Grandmother   . Thyroid disease Maternal Grandmother   . Mood  Disorder Maternal Grandmother   . Diabetes Paternal Grandmother   . Stroke Paternal Grandmother   . Heart disease Maternal Grandfather   . Diabetes Maternal Grandfather   . Prostate cancer Maternal Grandfather     SOCIAL HISTORY:  Social History   Socioeconomic History  . Marital status: Single    Spouse name: Not on file  . Number of children: 4  . Years of education: college  . Highest education level: Associate degree: occupational, Scientist, product/process development, or vocational program  Occupational History  . Occupation: Intake specialist  Social Needs  . Financial resource strain: Not on file  . Food insecurity:    Worry: Not on file    Inability: Not on file  . Transportation needs:    Medical: Not on file    Non-medical: Not on file  Tobacco Use  . Smoking status: Current Every Day Smoker    Packs/day: 0.50    Years: 20.00    Pack years: 10.00    Types: Cigarettes  . Smokeless tobacco: Never Used  Substance and Sexual Activity  . Alcohol use: Yes    Comment: occasionally - usually just weekends  . Drug use: Yes    Types: Marijuana    Comment: daily use  . Sexual activity: Yes    Birth control/protection: Surgical    Comment: tubal ~2007  Lifestyle  . Physical activity:    Days per week: Patient refused    Minutes per session: Patient refused  . Stress: Not on file  Relationships  . Social connections:    Talks on phone: Not on file    Gets together: Not on file    Attends religious service: Not on file    Active member of club or organization: Not on file    Attends meetings of clubs or organizations: Not on file    Relationship status: Not on file  . Intimate partner violence:    Fear of current or ex partner: Not on file    Emotionally abused: Not on file    Physically abused: Not on file    Forced sexual activity: Not on file  Other Topics Concern  . Not on file  Social History Narrative   Lives at home with her four daughters.   Right-handed.   Caffeine use:   2 cups per day     PHYSICAL EXAM   Vitals:   06/28/17 0758  BP: (!) 128/97  Pulse: 80  Weight: 171 lb (77.6 kg)  Height:  (1.575 m)    Not recorded      Body mass index is 31.28 kg/m.  PHYSICAL EXAMNIATION:  Gen: NAD, conversant, well nourised, obese, well groomed                     Cardiovascular: Regular rate rhythm, no peripheral edema, warm, nontender. Eyes: Conjunctivae clear without exudates or hemorrhage Neck: Supple, no carotid bruits.  Tenderness of right nuchal area with deep palpation Pulmonary: Clear to auscultation bilaterally   NEUROLOGICAL EXAM:  MENTAL STATUS: Speech:    Speech is normal; fluent and spontaneous with normal comprehension.  Cognition:     Orientation to time, place and person     Normal recent and remote memory     Normal Attention span and concentration     Normal Language, naming, repeating,spontaneous speech     Fund of knowledge   CRANIAL NERVES: CN II: Visual fields are full to confrontation. Fundoscopic exam is normal with sharp discs and no vascular changes. Pupils are round equal and briskly reactive to light. CN III, IV, VI: extraocular movement are normal. No ptosis. CN V: Facial sensation is intact to pinprick in all 3 divisions bilaterally. Corneal responses are intact.  CN VII: Face is symmetric with normal eye closure and smile. CN VIII: Hearing is normal to rubbing fingers CN IX, X: Palate elevates symmetrically. Phonation is normal. CN XI: Head turning and shoulder shrug are intact CN XII: Tongue is midline with normal movements and no atrophy.  MOTOR: There is no pronator drift of out-stretched arms. Muscle bulk and tone are normal. Muscle strength is normal.  REFLEXES: Reflexes are 2+ and symmetric at the biceps, triceps, knees, and ankles. Plantar responses are flexor.  SENSORY: Intact to light touch, pinprick, positional sensation and vibratory sensation are intact in fingers and  toes.  COORDINATION: Rapid alternating movements and fine finger movements are intact. There is no dysmetria on finger-to-nose and heel-knee-shin.    GAIT/STANCE: Posture is normal. Gait is steady with normal steps, base, arm swing, and turning. Heel and toe walking are normal. Tandem gait is normal.  Romberg is absent.   DIAGNOSTIC DATA (LABS, IMAGING, TESTING) - I reviewed patient records, labs, notes, testing and imaging myself where available.   ASSESSMENT AND PLAN  Larsen Zettel is a 37 y.o. female   Chronic migraine, Likely a component of cervicogenic pain  I performed trigger point injection at the right nuchal area today,  Nortriptyline 10 mg titrating to 20 mg every night as preventive medications   Imitrex as needed,   Tizanidine as needed  Also suggested hot compression, neck stretching exercise,   Levert Feinstein, M.D. Ph.D.  [facility] [address], [address] [place], [place] [address] Ph: [phone] Fax: [phone]  CC: Danelle Berry, PA-C

## 2017-06-28 NOTE — Progress Notes (Signed)
I have performed trigger point injection today for complaints of constant right nuchal area pain, radiating pain to the right retro-orbital area.  Failed multiple over-the-counter medications,  0.5% of Marcaine 1.5 cc mixed together with betamethasone 1.5 cc (6 mg/mL)  Injection was performed along right nuchal line, where she has some mild tenderness, and right cervical paraspinal area, multiple injection site performed.  She tolerated injection well, reported immediate relief of pressure after the injection.

## 2017-07-24 DIAGNOSIS — H04123 Dry eye syndrome of bilateral lacrimal glands: Secondary | ICD-10-CM | POA: Diagnosis not present

## 2017-07-24 DIAGNOSIS — H11823 Conjunctivochalasis, bilateral: Secondary | ICD-10-CM | POA: Diagnosis not present

## 2017-07-24 DIAGNOSIS — R51 Headache: Secondary | ICD-10-CM | POA: Diagnosis not present

## 2017-07-24 DIAGNOSIS — H10413 Chronic giant papillary conjunctivitis, bilateral: Secondary | ICD-10-CM | POA: Diagnosis not present

## 2017-07-25 ENCOUNTER — Other Ambulatory Visit: Payer: Self-pay | Admitting: Family Medicine

## 2017-07-25 DIAGNOSIS — R42 Dizziness and giddiness: Secondary | ICD-10-CM

## 2017-07-25 NOTE — Telephone Encounter (Signed)
Ok to refill??  Last office visit/ refill 06/15/2017. 

## 2017-08-01 ENCOUNTER — Encounter: Payer: 59 | Admitting: Family Medicine

## 2017-08-02 ENCOUNTER — Encounter: Payer: Self-pay | Admitting: Family Medicine

## 2017-08-02 ENCOUNTER — Ambulatory Visit (INDEPENDENT_AMBULATORY_CARE_PROVIDER_SITE_OTHER): Payer: 59 | Admitting: Family Medicine

## 2017-08-02 ENCOUNTER — Other Ambulatory Visit: Payer: Self-pay

## 2017-08-02 VITALS — BP 132/76 | HR 90 | Temp 98.2°F | Resp 14 | Ht 62.0 in | Wt 174.0 lb

## 2017-08-02 DIAGNOSIS — A749 Chlamydial infection, unspecified: Secondary | ICD-10-CM

## 2017-08-02 DIAGNOSIS — F419 Anxiety disorder, unspecified: Secondary | ICD-10-CM | POA: Diagnosis not present

## 2017-08-02 DIAGNOSIS — F329 Major depressive disorder, single episode, unspecified: Secondary | ICD-10-CM

## 2017-08-02 DIAGNOSIS — Z124 Encounter for screening for malignant neoplasm of cervix: Secondary | ICD-10-CM

## 2017-08-02 DIAGNOSIS — N889 Noninflammatory disorder of cervix uteri, unspecified: Secondary | ICD-10-CM | POA: Diagnosis not present

## 2017-08-02 DIAGNOSIS — I10 Essential (primary) hypertension: Secondary | ICD-10-CM | POA: Diagnosis not present

## 2017-08-02 DIAGNOSIS — F32A Depression, unspecified: Secondary | ICD-10-CM

## 2017-08-02 DIAGNOSIS — Z9889 Other specified postprocedural states: Secondary | ICD-10-CM

## 2017-08-02 DIAGNOSIS — F411 Generalized anxiety disorder: Secondary | ICD-10-CM | POA: Diagnosis not present

## 2017-08-02 DIAGNOSIS — E079 Disorder of thyroid, unspecified: Secondary | ICD-10-CM | POA: Diagnosis not present

## 2017-08-02 DIAGNOSIS — N760 Acute vaginitis: Secondary | ICD-10-CM | POA: Diagnosis not present

## 2017-08-02 DIAGNOSIS — B9689 Other specified bacterial agents as the cause of diseases classified elsewhere: Secondary | ICD-10-CM

## 2017-08-02 DIAGNOSIS — F410 Panic disorder [episodic paroxysmal anxiety] without agoraphobia: Secondary | ICD-10-CM

## 2017-08-02 DIAGNOSIS — R87619 Unspecified abnormal cytological findings in specimens from cervix uteri: Secondary | ICD-10-CM

## 2017-08-02 DIAGNOSIS — Z8659 Personal history of other mental and behavioral disorders: Secondary | ICD-10-CM | POA: Insufficient documentation

## 2017-08-02 DIAGNOSIS — Z0000 Encounter for general adult medical examination without abnormal findings: Secondary | ICD-10-CM

## 2017-08-02 LAB — WET PREP FOR TRICH, YEAST, CLUE

## 2017-08-02 MED ORDER — QUETIAPINE FUMARATE ER 50 MG PO TB24: ORAL_TABLET | ORAL | 0 refills | Status: DC

## 2017-08-02 NOTE — Assessment & Plan Note (Signed)
Pt near goal BP Continue norvasc 10 mg daily Recheck labs and BP in 4 months (6 months since initiating meds)

## 2017-08-02 NOTE — Assessment & Plan Note (Signed)
Monitor sx and TSH q 6 month Will not treat with synthroid with normal labs and pt sx

## 2017-08-02 NOTE — Assessment & Plan Note (Signed)
Pt has been requesting refills of benzo's Anxiety is getting worse tx with seroquel (for GAD and bipolar) Psychiatry referral

## 2017-08-02 NOTE — Patient Instructions (Signed)
I will have to contact you later today regarding medications.  I need to consult with your neurologist first, or at least contact pharmacy regarding medications.  First line for bipolar history is lithium or other mood stabilizers.    I have put in a referral to psychiatry, but usually the patient needs to contact them and push to arrange.  I would even suggest going to one of the clinics with walk-in assessment to get established.  See the resources below.  I would contact the [facility] first.  With the cone insurance you also have access to counseling that is free or supplemented.    RESOURCE GUIDE       Behavioral Health Resources in the [facility]  Intensive Outpatient Programs: [facility]      [address] [place], [place] [phone] Both a day and evening program       [facility]     [address]        [place], [place] [address] [phone]         ADS: [facility] [address] [place] [phone]  [facility] ACCESS LINE: [phone] or [phone] [address] [place], [place] [address] [email or web address]   Substance Abuse Resources: - [facility]  [phone] - [facility] [phone] - [facility] [phone] Floydene Flock[facility] [phone] - Residential & Outpatient Substance Abuse Program  [phone]  Psychological Services: Tressie Ellis[facility]  [phone] [facility]  [phone] - [facility], [phone] [place] [address], [place], ACCESS LINE: [phone] or [phone], [email or web address]  Mobile Crisis Teams:                                        Therapeutic Alternatives         Mobile Crisis Care Unit [phone]             [facility] [address]  [place] [phone]                                         Interventionist [address]haron DeEsch [address] [place] [phone]  Self-Help/Support Groups: [facility] Health Assoc[facility] of [place] Variety of support groups [phone] (call for more info)  Narcotics Anonymous (NA) Caring Services [address] [place] - 2 meetings at this location  Residential Treatment Programs:  [facility]      [address]        [place]       [phone]         [facility] [address], [place] [address] [place], [place]  [address] [phone]  [facility]  [address] [place], [place] [address] [phone] Admissions: 8am-3pm M-F  [facility]     [address]        [place], [place] [address]       [phone]         [facility] [address] [place], [place] [phone]  [facility] [address] [place], [place] [phone]  [facility] - Intensive Outpatient      [address]  [place], [place]       [phone]         [facility] ([facility]     [address] [place], [place] [phone] or [phone]  Residential Treatment Services (RTS), Medicaid [address] [place], [place] [phone]  [facility] [address]                                               [address] [place] [phone]  [facility] Resources: [phone]               General Therapy                                                Angie Fava, PhD        [address] [place], [place] [address]         [phone]   Insurance  [facility]   [address] [place], [place] [address] [phone]  [facility] [address] [place], [place] [address] [phone] Insurance/Medicaid/sponsorship through  [facility]                                              [address]                                        [place], [place] [address]    Therapy/tele-psych/case         [phone]          [facility] [address], [place]  [address]  Adolescent/group home/case management [phone]                                           Creola Corn PhD       General therapy       Insurance   [phone]         Dr. Lolly Mustache, [place], M-F [phone]  [facility]  [employer] [facility] Dept. [address]                 [address]         [address] [place] [address]  [address]  [facility] Phone:  [phone]                                  Phone:  [phone]                   Phone:  [phone]  [facility], [phone] - [facility] - [phone]       -     [facility] in [place], [address]             [phone], Insurance

## 2017-08-02 NOTE — Progress Notes (Addendum)
Patient: Yolanda Estrada, Female    DOB: 06/27/1980, 37 y.o.   MRN: [id number] Visit Date: 08/03/2017  Today's Provider: Danelle Berry, PA-C   Chief Complaint  Patient presents with  . CPE with PAP    is fasting  . Depression   Subjective:    Annual physical exam Yolanda Estrada is a 37 y.o. female who presents today for health maintenance and complete physical. She feels fairly well. She reports exercising not very much. She reports she is sleeping poorly.  ----------------------------------------------------------------- HA's are improving and so is neck pain after seeing neurology and getting injections.  She is not taking HA/migraine medicine.  Vertigo and sinus congestion improving.  Anxiety and moods much worse.  She endorses severe aggitation, stress, inability to sleep, concern that she is going to crash and become depressed.  She is not sleeping well at all.  She is spending money she shouldn't spend and then feels guilty.  She was on various medicines in the past such as lithium, abilify, zoloft, propanolol, seroquel, effexor.  States she was dx with bipolar 1, GAD.  Cognitive behavioral therapy did help in the past.  She has not requested any of her records regarding this.  She has not gone to psychiatry for eval.  She returns after requesting more benzo's and she was notified several times that policy is to not prescribe benzo's or any controlled substance with risk of dependence, for long periods of time.  I also have directly told her many times that if her anxiety continues to be unmanagable, then she needs a daily medication.  Review of Systems  Constitutional: Negative.   HENT: Negative.   Eyes: Negative.   Respiratory: Negative.   Cardiovascular: Negative.   Gastrointestinal: Negative.   Endocrine: Negative.   Musculoskeletal: Negative.   Skin: Negative.   Allergic/Immunologic: Negative.   Neurological: Negative.   Psychiatric/Behavioral: Positive for  agitation, decreased concentration, dysphoric mood and sleep disturbance. Negative for self-injury and suicidal ideas. The patient is nervous/anxious.   All other systems reviewed and are negative.   Social History      She  reports that she has been smoking cigarettes.  She has a 10.00 pack-year smoking history. She has never used smokeless tobacco. She reports that she drinks alcohol. She reports that she has current or past drug history. Drug: Marijuana.       Social History   Socioeconomic History  . Marital status: Single    Spouse name: Not on file  . Number of children: 4  . Years of education: college  . Highest education level: Associate degree: occupational, Scientist, product/process development, or vocational program  Occupational History  . Occupation: Intake specialist  Social Needs  . Financial resource strain: Not on file  . Food insecurity:    Worry: Not on file    Inability: Not on file  . Transportation needs:    Medical: Not on file    Non-medical: Not on file  Tobacco Use  . Smoking status: Current Every Day Smoker    Packs/day: 0.50    Years: 20.00    Pack years: 10.00    Types: Cigarettes  . Smokeless tobacco: Never Used  Substance and Sexual Activity  . Alcohol use: Yes    Comment: occasionally - usually just weekends  . Drug use: Yes    Types: Marijuana    Comment: daily use  . Sexual activity: Yes    Birth control/protection: Surgical    Comment: tubal ~2007  Lifestyle  .  Physical activity:    Days per week: Patient refused    Minutes per session: Patient refused  . Stress: Not on file  Relationships  . Social connections:    Talks on phone: Not on file    Gets together: Not on file    Attends religious service: Not on file    Active member of club or organization: Not on file    Attends meetings of clubs or organizations: Not on file    Relationship status: Not on file  Other Topics Concern  . Not on file  Social History Narrative   Lives at home with her four  daughters.   Right-handed.   Caffeine use:  2 cups per day    Past Medical History:  Diagnosis Date  . Anxiety   . Depression   . Graves disease   . Hypertension   . Thyroid disease      Patient Active Problem List   Diagnosis Date Noted  . Chlamydia 08/03/2017  . BV (bacterial vaginosis) 08/03/2017  . History of bipolar disorder 08/02/2017  . Abnormal appearance of cervix 08/02/2017  . Chronic migraine 06/28/2017  . Occipital pain 06/28/2017  . Hypertension 06/20/2017  . Thyroid disease 05/24/2017  . Generalized anxiety disorder with panic attacks 05/23/2017  . Headache 05/23/2017  . Dizziness 03/07/2017  . History of Graves' disease 03/07/2017    Past Surgical History:  Procedure Laterality Date  . TUBAL LIGATION      Family History        Family Status  Relation Name Status  . Mother  Alive  . Father  Alive  . MGM  (Not Specified)  . PGM  (Not Specified)  . MGF  (Not Specified)        Her family history includes Asthma in her father and mother; Cancer in her father; Diabetes in her maternal grandfather, maternal grandmother, and paternal grandmother; Heart disease in her father, maternal grandfather, and maternal grandmother; Mood Disorder in her maternal grandmother; Prostate cancer in her maternal grandfather; Rheum arthritis in her mother; Stroke in her paternal grandmother; Thyroid disease in her maternal grandmother and mother.      No Known Allergies   Current Outpatient Medications:  .  amLODipine (NORVASC) 10 MG tablet, Take 1 tablet (10 mg total) by mouth daily., Disp: 90 tablet, Rfl: 2 .  cetirizine (ZYRTEC) 10 MG tablet, Take 1 tablet (10 mg total) by mouth daily., Disp: 30 tablet, Rfl: 11 .  mometasone (NASONEX) 50 MCG/ACT nasal spray, Place 2 sprays into the nose daily., Disp: 17 g, Rfl: 12 .  montelukast (SINGULAIR) 10 MG tablet, Take 1 tablet (10 mg total) by mouth at bedtime., Disp: 30 tablet, Rfl: 3 .  Multiple Vitamins-Minerals (ADULT  GUMMY) CHEW, Chew 1 each by mouth daily., Disp: , Rfl:  .  SUMAtriptan (IMITREX) 50 MG tablet, Take 1 tablet (50 mg total) by mouth every 2 (two) hours as needed for migraine. May repeat in 2 hours if headache persists or recurs., Disp: 10 tablet, Rfl: 5 .  tiZANidine (ZANAFLEX) 4 MG tablet, Take 1 tablet (4 mg total) by mouth every 6 (six) hours as needed for muscle spasms., Disp: 30 tablet, Rfl: 3 .  azithromycin (ZITHROMAX) 250 MG tablet, Take 4 tablets (1,000 mg total) by mouth once for 1 dose., Disp: 4 each, Rfl: 0 .  metroNIDAZOLE (FLAGYL) 500 MG tablet, Take 1 tablet (500 mg total) by mouth 2 (two) times daily for 7 days., Disp: 14 tablet, Rfl:  0 .  QUEtiapine (SEROQUEL XR) 50 MG TB24 24 hr tablet, Take 1 tab PO QHS for 1d, then take 2 tab PO QHS for 1d, increase until max dose of 6 tabs (300 mg) PO QHS, Disp: 30 each, Rfl: 0   Patient Care Team: Danelle Berry, PA-C as PCP - General (Family Medicine)      Objective:   Vitals: BP 132/76   Pulse 90   Temp 98.2 F (36.8 C) (Oral)   Resp 14   Ht 5\' 2"  (1.575 m)   Wt 174 lb (78.9 kg)   LMP 07/12/2017 Comment: regular  SpO2 99%   BMI 31.83 kg/m    Vitals:   08/02/17 0902  BP: 132/76  Pulse: 90  Resp: 14  Temp: 98.2 F (36.8 C)  TempSrc: Oral  SpO2: 99%  Weight: 174 lb (78.9 kg)  Height: 5\' 2"  (1.575 m)     Physical Exam  Constitutional: She is oriented to person, place, and time. Vital signs are normal. She appears well-developed and well-nourished.  Non-toxic appearance. She does not have a sickly appearance. She does not appear ill. No distress.  HENT:  Head: Normocephalic and atraumatic.  Right Ear: External ear normal.  Left Ear: External ear normal.  Nose: Nose normal.  Mouth/Throat: Uvula is midline, oropharynx is clear and moist and mucous membranes are normal.  Eyes: Pupils are equal, round, and reactive to light. Conjunctivae, EOM and lids are normal.  Neck: Normal range of motion and phonation normal. Neck  supple. No tracheal deviation present.  Cardiovascular: Normal rate, regular rhythm, normal heart sounds and normal pulses. Exam reveals no gallop and no friction rub.  No murmur heard. Pulses:      Radial pulses are 2+ on the right side, and 2+ on the left side.       Posterior tibial pulses are 2+ on the right side, and 2+ on the left side.  Pulmonary/Chest: Effort normal and breath sounds normal. No stridor. No respiratory distress. She has no wheezes. She has no rhonchi. She has no rales. She exhibits no tenderness.  Abdominal: Soft. Normal appearance and bowel sounds are normal. She exhibits no distension. There is no tenderness. There is no rebound and no guarding.  Musculoskeletal: Normal range of motion. She exhibits no edema or deformity.  Lymphadenopathy:    She has no cervical adenopathy.  Neurological: She is alert and oriented to person, place, and time. She has normal strength. She displays no tremor. No cranial nerve deficit or sensory deficit. She exhibits normal muscle tone. She displays no seizure activity. Coordination and gait normal.  Skin: Skin is warm, dry and intact. Capillary refill takes less than 2 seconds. No rash noted. She is not diaphoretic. No pallor.  Psychiatric: Judgment normal. Her mood appears anxious. Her affect is not angry and not inappropriate. Her speech is rapid and/or pressured. Her speech is not delayed and not slurred. She is agitated and hyperactive. Cognition and memory are normal. She does not exhibit a depressed mood. She expresses no homicidal and no suicidal ideation. She expresses no suicidal plans and no homicidal plans.     Depression Screen PHQ 2/9 Scores 08/02/2017 05/23/2017  PHQ - 2 Score 3 0  PHQ- 9 Score 17 -   Per care everywhere - 02/25/2001 cervical biopsy pathology report  SURGICAL PATHOLOGY TISSUE EXAM Routine 02/25/2001 12:00 AM EST  Results for this procedure are in the results section.    Lab Results - documented in this  encounter   Surgical Pathology Tissue Exam Surgical Pathology Tissue Exam  Narrative Performed At    GENERAL CATEGORIZATION:     INTERPRETATION/RESULT:  TISSUE LABELLED "BIOPSIES OF CERVIX":  ATYPICAL SQUAMOUS METAPLASIA WITH ACUTE AND CHRONIC INFLAMMATION AND  INFLAMMATORY RELATED SQUAMOUS ATYPIA.  NO DEFINITE DYSPLASIA IS IDENTIFIED.    PAP SMEAR CORRELATION: Pap smear [id number] is reviewed for correlation  purposes. The changes seen in this biopsy probably account for the atypical  cells noted in that smear. A residual more significant lesion is, however  not entirely excluded and repeat biopsy and/or frequent Pap smear evaluation  is recommended. P305  ABO/msa  Dr. Vivien Rota B. Hopkins has seen this case in intradepartmental consultation  and is in essential agreement with the above diagnosis.  ()    Electronically Signed By: Rudene AndaOaks M.D. Pathologist,   (Case signed 2001-02-27)    Recent Results (from the past 2160 hour(s))  Lipid panel     Status: Abnormal   Collection Time: 05/23/17 10:44 AM  Result Value Ref Range   Cholesterol 126 <200 mg/dL   HDL 47 (L) >54 mg/dL   Triglycerides 79 <098 mg/dL   LDL Cholesterol (Calc) 63 mg/dL (calc)    Comment: Reference range: <100 . Desirable range <100 mg/dL for primary prevention;   <70 mg/dL for patients with CHD or diabetic patients  with > or = 2 CHD risk factors. Marland Kitchen LDL-C is now calculated using the Martin-Hopkins  calculation, which is a validated novel method providing  better accuracy than the Friedewald equation in the  estimation of LDL-C.  Horald Pollen et al. Lenox Ahr. 1191;478(29): 2061-2068  (http://education.[facility].com/faq/FAQ164)    Total CHOL/HDL Ratio 2.7 <5.0 (calc)   Non-HDL Cholesterol (Calc) 79 <562 mg/dL (calc)    Comment: For patients with diabetes plus 1 major ASCVD risk  factor, treating to a non-HDL-C goal of <100 mg/dL  (LDL-C of  <13 mg/dL) is considered a therapeutic  option.   COMPLETE METABOLIC PANEL WITH GFR     Status: None   Collection Time: 05/23/17 10:44 AM  Result Value Ref Range   Glucose, Bld 81 65 - 99 mg/dL    Comment: .            Fasting reference interval .    BUN 13 7 - 25 mg/dL   Creat 0.86 5.78 - 4.69 mg/dL   GFR, Est Non African American 72 > OR = 60 mL/min/1.57m2   GFR, Est African American 83 > OR = 60 mL/min/1.37m2   BUN/Creatinine Ratio NOT APPLICABLE 6 - 22 (calc)   Sodium 139 135 - 146 mmol/L   Potassium 4.4 3.5 - 5.3 mmol/L   Chloride 107 98 - 110 mmol/L   CO2 26 20 - 32 mmol/L   Calcium 9.0 8.6 - 10.2 mg/dL   Total Protein 6.4 6.1 - 8.1 g/dL   Albumin 4.0 3.6 - 5.1 g/dL   Globulin 2.4 1.9 - 3.7 g/dL (calc)   AG Ratio 1.7 1.0 - 2.5 (calc)   Total Bilirubin 0.5 0.2 - 1.2 mg/dL   Alkaline phosphatase (APISO) 68 33 - 115 U/L   AST 17 10 - 30 U/L   ALT 16 6 - 29 U/L  CBC with Differential     Status: None   Collection Time: 05/23/17 10:44 AM  Result Value Ref Range   WBC 7.0 3.8 - 10.8 Thousand/uL   RBC 4.87 3.80 - 5.10 Million/uL   Hemoglobin 13.3 11.7 - 15.5 g/dL   HCT 40.0  35.0 - 45.0 %   MCV 82.1 80.0 - 100.0 fL   MCH 27.3 27.0 - 33.0 pg   MCHC 33.3 32.0 - 36.0 g/dL   RDW 41.3 24.4 - 01.0 %   Platelets 248 140 - 400 Thousand/uL   MPV 9.8 7.5 - 12.5 fL   Neutro Abs 3,493 1,500 - 7,800 cells/uL   Lymphs Abs 2,975 850 - 3,900 cells/uL   WBC mixed population 427 200 - 950 cells/uL   Eosinophils Absolute 77 15 - 500 cells/uL   Basophils Absolute 28 0 - 200 cells/uL   Neutrophils Relative % 49.9 %   Total Lymphocyte 42.5 %   Monocytes Relative 6.1 %   Eosinophils Relative 1.1 %   Basophils Relative 0.4 %  TSH     Status: None   Collection Time: 05/23/17 10:44 AM  Result Value Ref Range   TSH 0.71 mIU/L    Comment:           Reference Range .           > or = 20 Years  0.40-4.50 .                Pregnancy Ranges           First trimester    0.26-2.66            Second trimester   0.55-2.73           Third trimester    0.43-2.91   T4, Free     Status: None   Collection Time: 05/23/17 10:44 AM  Result Value Ref Range   Free T4 1.2 0.8 - 1.8 ng/dL  C. trachomatis/N. gonorrhoeae RNA     Status: Abnormal   Collection Time: 08/02/17 10:15 AM  Result Value Ref Range   C. trachomatis RNA, TMA DETECTED (A) NOT DETECT    Comment: . A positive CT or NG Nucleic Acid Amplification Test (NAAT) result should be interpreted in conjunction with other laboratory and clinical data available to the clinician. If clinically indicated, further  testing can be performed on the same sample using an alternate molecular target. To order alternate target test use 15031 (C. trachomatis) or  27253 (N. gonorrhoeae). .    N. gonorrhoeae RNA, TMA NOT DETECTED NOT DETECT    Comment: This test was performed using the APTIMA COMBO2 Assay ([employer] . The analytical performance characteristics of this  assay, when used to test SurePath specimens have been determined by [employer]. Mellody Drown PREP FOR TRICH, YEAST, CLUE     Status: None   Collection Time: 08/02/17 10:15 AM  Result Value Ref Range   Source: GENITAL    RESULT      Comment: TRICHOMONAS VAGINALIS-NONE SEEN YEAST-NONE SEEN CLUE CELLS-PRESENT EPITHELIAL CELLS-PRESENT BACTERIA-MANY WBCS-FEW   PAP,TP IMGw/HPV RNA,rflx HPVTYPE16,18/45     Status: None   Collection Time: 08/02/17 10:15 AM  Result Value Ref Range   Clinical Information:      Comment: None given   LMP:      Comment: 07/12/17   PREV. PAP:      Comment: NONE GIVEN   PREV. BX:      Comment: NONE GIVEN   HPV DNA Probe-Source      Comment: Endocervix   STATEMENT OF ADEQUACY:      Comment: Satisfactory for evaluation. Endocervical/transformation zone component present.    INTERPRETATION/RESULT:      Comment: Negative for intraepithelial lesion or malignancy.   Comment:  Comment: This Pap test has been evaluated with  computer assisted technology.    CYTOTECHNOLOGIST:      Comment: JRW, CT(ASCP) CT screening location: [address], [place], [place] [address]    HPV DNA High Risk Not Detected Not Detect    Comment: This test was performed using the APTIMA HPV Assay ([employer] . This assay detects E6/E7 viral messenger RNA (mRNA) from 14 high-risk HPV types (16,18,31,33,35,39,45,51,52,56,58,59,66,68). . The analytical performance characteristics of this assay have been determined by [facility]. The modifications have not been cleared or approved by the FDA. This assay has been validated pursuant to the CLIA regulations and is used for clinical purposes. EXPLANATORY NOTE:  . The Pap is a screening test for cervical cancer. It is  not a diagnostic test and is subject to false negative  and false positive results. It is most reliable when a  satisfactory sample, regularly obtained, is submitted  with relevant clinical findings and history, and when  the Pap result is evaluated along with historic and  current clinical information. .      Assessment & Plan:     Routine Health Maintenance and Physical Exam  Exercise Activities and Dietary recommendations Goals    None      Immunization History  Administered Date(s) Administered  . Influenza-Unspecified 11/06/2016  . Tdap 02/07/2011    Health Maintenance  Topic Date Due  . INFLUENZA VACCINE  09/06/2017  . PAP SMEAR  08/02/2020  . TETANUS/TDAP  02/06/2021  . HIV Screening  Completed     Discussed health benefits of physical activity, and encouraged her to engage in regular exercise appropriate for her age and condition.    --------------------------------------------------------------------   Problem List Items Addressed This Visit      Cardiovascular and Mediastinum   Hypertension (Chronic)    Pt near goal BP Continue norvasc 10 mg daily Recheck labs and BP in 4 months (6 months since  initiating meds)      Relevant Medications   amLODipine (NORVASC) 10 MG tablet     Endocrine   Thyroid disease    Monitor sx and TSH q 6 month Will not treat with synthroid with normal labs and pt sx        Genitourinary   Abnormal appearance of cervix   Relevant Orders   PAP, Thin Prep w/HPV rflx HPV Type 16/18   C. trachomatis/N. gonorrhoeae RNA (Completed)   WET PREP FOR TRICH, YEAST, CLUE (Completed)   Ambulatory referral to Obstetrics / Gynecology   BV (bacterial vaginosis)   Relevant Medications   azithromycin (ZITHROMAX) 250 MG tablet   metroNIDAZOLE (FLAGYL) 500 MG tablet     Other   Generalized anxiety disorder with panic attacks    Pt has been requesting refills of benzo's Anxiety is getting worse tx with seroquel (for GAD and bipolar) Psychiatry referral      Relevant Medications   QUEtiapine (SEROQUEL XR) 50 MG TB24 24 hr tablet   Other Relevant Orders   Ambulatory referral to Psychiatry   History of bipolar disorder   Relevant Medications   QUEtiapine (SEROQUEL XR) 50 MG TB24 24 hr tablet   Other Relevant Orders   Ambulatory referral to Psychiatry   Chlamydia   Relevant Medications   azithromycin (ZITHROMAX) 250 MG tablet   metroNIDAZOLE (FLAGYL) 500 MG tablet    Other Visit Diagnoses    General medical exam    -  Primary   Relevant Orders   PAP,  Thin Prep w/HPV rflx HPV Type 16/18   PAP,TP IMGw/HPV RNA,rflx HPVTYPE16,18/45 (Completed)   Anxiety and depression       Relevant Medications   QUEtiapine (SEROQUEL XR) 50 MG TB24 24 hr tablet   Other Relevant Orders   Ambulatory referral to Psychiatry   History of colposcopy with cervical biopsy       Relevant Orders   PAP, Thin Prep w/HPV rflx HPV Type 16/18   Ambulatory referral to Obstetrics / Gynecology   PAP,TP IMGw/HPV RNA,rflx [id number] (Completed)   Encounter for Pap cervical smear following prior abnormal smear         Anxiety/depression, now reports history of bipolar - she does  appear extremely anxious, aggitated, and manic.  She wants to initiate tx again at this time for mental health dx.   She has not requested any of her records regarding this.  She has not gone to psychiatry for eval.  She returns after requesting more benzo's and she was notified several times that policy is to not prescribe benzo's or any controlled substance with risk of dependence, for long periods of time.  We have discussed many times that if her anxiety continues to be unmanagable, then she needs a daily medication and not benzos.   She has not taken nortriptyline for headaches, started seroquel titration 50 increasing to 300 mg q night, pt needs to go to psych, she has been told this and referrals have been done. Pt understands that there is possible medication reaction with nortriptyline and seroquel and she verbalized understanding that she is to d/c nortriptyline for now, and go to psych.  Blood pressure is almost at goal, appears significantly improved on Norvasc 10 mg.  No concerning side effects.  Due to have labs redrawn in 4 months.   Danelle Berry, PA-C 08/03/17 7:25 PM  [facility]     Results for orders placed or performed in visit on 08/02/17  C. trachomatis/N. gonorrhoeae RNA  Result Value Ref Range   C. trachomatis RNA, TMA DETECTED (A) NOT DETECT   N. gonorrhoeae RNA, TMA NOT DETECTED NOT DETECT  WET PREP FOR TRICH, YEAST, CLUE  Result Value Ref Range   Source: GENITAL    RESULT    PAP,TP IMGw/HPV RNA,rflx [id number]  Result Value Ref Range   Clinical Information:     LMP:     PREV. PAP:     PREV. BX:     HPV DNA Probe-Source     STATEMENT OF ADEQUACY:     INTERPRETATION/RESULT:     Comment:     CYTOTECHNOLOGIST:     HPV DNA High Risk Not Detected Not Detect     + for chlamydia Pt notified and azithro 1 g PO once sent to pharmacy, told to notify all partners for testing and treatment.  Will notify health  department.  Marland Kitchen  azithromycin (ZITHROMAX) 250 MG tablet, Take 4 tablets (1,000 mg total) by mouth once for 1 dose., Disp: 4 each, Rfl: 0 .  metroNIDAZOLE (FLAGYL) 500 MG tablet, Take 1 tablet (500 mg total) by mouth 2 (two) times daily for 7 days., Disp: 14 tablet, Rfl: 0

## 2017-08-03 ENCOUNTER — Encounter: Payer: Self-pay | Admitting: Family Medicine

## 2017-08-03 DIAGNOSIS — A749 Chlamydial infection, unspecified: Secondary | ICD-10-CM | POA: Insufficient documentation

## 2017-08-03 DIAGNOSIS — N760 Acute vaginitis: Secondary | ICD-10-CM

## 2017-08-03 DIAGNOSIS — B9689 Other specified bacterial agents as the cause of diseases classified elsewhere: Secondary | ICD-10-CM | POA: Insufficient documentation

## 2017-08-03 LAB — PAP, TP IMAGING W/ HPV RNA, RFLX HPV TYPE 16,18/45: HPV DNA HIGH RISK: NOT DETECTED

## 2017-08-03 LAB — C. TRACHOMATIS/N. GONORRHOEAE RNA
C. trachomatis RNA, TMA: DETECTED — AB
N. gonorrhoeae RNA, TMA: NOT DETECTED

## 2017-08-03 MED ORDER — AZITHROMYCIN 250 MG PO TABS
1000.0000 mg | ORAL_TABLET | Freq: Once | ORAL | 0 refills | Status: AC
Start: 2017-08-03 — End: 2017-08-03

## 2017-08-03 MED ORDER — AMLODIPINE BESYLATE 10 MG PO TABS: 10.0000 mg | ORAL_TABLET | Freq: Every day | ORAL | 2 refills | Status: AC

## 2017-08-03 MED ORDER — METRONIDAZOLE 500 MG PO TABS: 500.0000 mg | ORAL_TABLET | Freq: Two times a day (BID) | ORAL | 0 refills | Status: AC

## 2017-08-03 NOTE — Progress Notes (Signed)
Carollee HerterShannon I have notified patient of her test results.  Please notify health department with necessary paperwork for positive chlamydia test thank you

## 2017-08-03 NOTE — Addendum Note (Signed)
Addended by: Danelle BerryAPIA, Seara Hinesley on: 08/03/2017 07:32 PM   Modules accepted: Orders

## 2017-08-08 ENCOUNTER — Encounter: Payer: Self-pay | Admitting: *Deleted

## 2017-08-17 ENCOUNTER — Other Ambulatory Visit: Payer: Self-pay | Admitting: Family Medicine

## 2017-08-17 MED ORDER — QUETIAPINE FUMARATE 50 MG PO TABS: ORAL_TABLET | ORAL | 0 refills | Status: AC

## 2017-08-17 NOTE — Progress Notes (Signed)
seroquel adjusted to lower dose with gradual titration from 50 mg x 1 week to 100 mg x 1 week for tx of mixed bipolar, agitation, severe anxiety.  Pt still trying to get into psychiatry for eval and management.  Have contacted pt and she was advised to take a sick day and go to any walk in assessment, since she has not been able to get an appointment yet.  Work note will be provided for any appointment/assessment she can establish.

## 2018-01-23 ENCOUNTER — Encounter: Payer: Self-pay | Admitting: Family Medicine

## 2018-01-23 NOTE — Telephone Encounter (Signed)
If she cannot wait for OBGYN, she needs to make an appt to be seen/rechecked

## 2018-01-28 ENCOUNTER — Encounter: Payer: 59 | Admitting: Women's Health

## 2018-02-28 ENCOUNTER — Encounter: Payer: Self-pay | Admitting: Adult Health

## 2018-02-28 ENCOUNTER — Ambulatory Visit (INDEPENDENT_AMBULATORY_CARE_PROVIDER_SITE_OTHER): Payer: 59 | Admitting: Adult Health

## 2018-02-28 VITALS — BP 135/88 | HR 93 | Ht 62.0 in | Wt 177.0 lb

## 2018-02-28 DIAGNOSIS — N852 Hypertrophy of uterus: Secondary | ICD-10-CM | POA: Diagnosis not present

## 2018-02-28 DIAGNOSIS — Z113 Encounter for screening for infections with a predominantly sexual mode of transmission: Secondary | ICD-10-CM

## 2018-02-28 DIAGNOSIS — N898 Other specified noninflammatory disorders of vagina: Secondary | ICD-10-CM

## 2018-02-28 DIAGNOSIS — N921 Excessive and frequent menstruation with irregular cycle: Secondary | ICD-10-CM | POA: Diagnosis not present

## 2018-02-28 DIAGNOSIS — R140 Abdominal distension (gaseous): Secondary | ICD-10-CM | POA: Diagnosis not present

## 2018-02-28 NOTE — Progress Notes (Signed)
Patient ID: Yolanda Estrada, female   DOB: 02/05/1981, 38 y.o.   MRN: [id number] History of Present Illness: Yolanda Estrada is a 38 year old black female,G4P4, in complaining of vaginal discharge and bloating and has heavy periods.She had pap and physical in June 2019 with Henry Russel, PA. She has history of abnormal paps with cryo and [facility].She was treated for chlamydia in June PCP is Danelle Berry PA.    Current Medications, Allergies, Past Medical History, Past Surgical History, Family History and Social History were reviewed in [employer] record.     Review of Systems: +vaginal discharge  +bloating Periods last 3-8 days with 2-3 heavy may change pads every hour  Has pain with periods  Mom had fibroids     Physical Exam: BP 135/88 (BP Location: Left Arm, Patient Position: Sitting, Cuff Size: Large)   Pulse 93   Ht 5\' 2"  (1.575 m)   Wt 177 lb (80.3 kg)   LMP 02/25/2018   BMI 32.37 kg/m  General:  Well developed, well nourished, no acute distress Skin:  Warm and dry Pelvic:  External genitalia is normal in appearance, no lesions.  The vagina is normal in appearance, +period blood. Urethra has no lesions or masses. The cervix is bulbous, irregular at os, sp CONE.  Uterus is enlarged, non tender.  No adnexal masses or tenderness noted.Bladder is non tender, no masses felt.Nuswab obtained.  Psych:  No mood changes, alert and cooperative,seems happy Fall risk is low PHQ 9 score is 15, starting on meds and denies being suicidal.  Examination chaperoned by Federico Flake CMA.  Will get [place] to assess uterus.    Impression: 1. Menorrhagia with irregular cycle   2. Bloated abdomen   3. Vaginal discharge   4. Enlarged uterus   5. Screening examination for STD (sexually transmitted disease)       Plan: Nuswab sent Return in 1 week for GYN [place] Will talk when results back Review handout on menorrhagia

## 2018-02-28 NOTE — Patient Instructions (Signed)
Menorrhagia    Menorrhagia is a condition in which menstrual periods are heavy or last longer than normal. With menorrhagia, most periods a woman has may cause enough blood loss and cramping that she becomes unable to take part in her usual activities.  What are the causes?  Common causes of this condition include:  · Noncancerous growths in the uterus (polyps or fibroids).  · An imbalance of the estrogen and progesterone hormones.  · One of the ovaries not releasing an egg during one or more months.  · A problem with the thyroid gland (hypothyroid).  · Side effects of having an intrauterine device (IUD).  · Side effects of some medicines, such as anti-inflammatory medicines or blood thinners.  · A bleeding disorder that stops the blood from clotting normally.  In some cases, the cause of this condition is not known.  What are the signs or symptoms?  Symptoms of this condition include:  · Routinely having to change your pad or tampon every 1-2 hours because it is completely soaked.  · Needing to use pads and tampons at the same time because of heavy bleeding.  · Needing to wake up to change your pads or tampons during the night.  · Passing blood clots larger than 1 inch (2.5 cm) in size.  · Having bleeding that lasts for more than 7 days.  · Having symptoms of low iron levels (anemia), such as tiredness, fatigue, or shortness of breath.  How is this diagnosed?  This condition may be diagnosed based on:  · A physical exam.  · Your symptoms and menstrual history.  · Tests, such as:  ? Blood tests to check if you are pregnant or have hormonal changes, a bleeding or thyroid disorder, anemia, or other problems.  ? Pap test to check for cancerous changes, infections, or inflammation.  ? Endometrial biopsy. This test involves removing a tissue sample from the lining of the uterus (endometrium) to be examined under a microscope.  ? Pelvic ultrasound. This test uses sound waves to create images of your uterus, ovaries, and  vagina. The images can show if you have fibroids or other growths.  ? Hysteroscopy. For this test, a small telescope is used to look inside your uterus.  How is this treated?  Treatment may not be needed for this condition. If it is needed, the best treatment for you will depend on:  · Whether you need to prevent pregnancy.  · Your desire to have children in the future.  · The cause and severity of your bleeding.  · Your personal preference.  Medicines are the first step in treatment. You may be treated with:  · Hormonal birth control methods. These treatments reduce bleeding during your menstrual period. They include:  ? Birth control pills.  ? Skin patch.  ? Vaginal ring.  ? Shots (injections) that you get every 3 months.  ? Hormonal IUD (intrauterine device).  ? Implants that go under the skin.  · Medicines that thicken blood and slow bleeding.  · Medicines that reduce swelling, such as ibuprofen.  · Medicines that contain an artificial (synthetic) hormone called progestin.  · Medicines that make the ovaries stop working for a short time.  · Iron supplements to treat anemia.  If medicines do not work, surgery may be done. Surgical options may include:  · Dilation and curettage (D&C). In this procedure, your health care provider opens (dilates) your cervix and then scrapes or suctions tissue from the endometrium to   reduce menstrual bleeding.  · Operative hysteroscopy. In this procedure, a small tube with a light on the end (hysteroscope) is used to view your uterus and help remove polyps that may be causing heavy periods.  · Endometrial ablation. This is when various techniques are used to permanently destroy your entire endometrium. After endometrial ablation, most women have little or no menstrual flow. This procedure reduces your ability to become pregnant.  · Endometrial resection. In this procedure, an electrosurgical wire loop is used to remove the endometrium. This procedure reduces your ability to become  pregnant.  · Hysterectomy. This is surgical removal of the uterus. This is a permanent procedure that stops menstrual periods. Pregnancy is not possible after a hysterectomy.  Follow these instructions at home:  Medicines  · Take over-the-counter and prescription medicines exactly as told by your health care provider. This includes iron pills.  · Do not change or switch medicines without asking your health care provider.  · Do not take aspirin or medicines that contain aspirin 1 week before or during your menstrual period. Aspirin may make bleeding worse.  General instructions  · If you need to change your sanitary pad or tampon more than once every 2 hours, limit your activity until the bleeding stops.  · Iron pills can cause constipation. To prevent or treat constipation while you are taking prescription iron supplements, your health care provider may recommend that you:  ? Drink enough fluid to keep your urine clear or pale yellow.  ? Take over-the-counter or prescription medicines.  ? Eat foods that are high in fiber, such as fresh fruits and vegetables, whole grains, and beans.  ? Limit foods that are high in fat and processed sugars, such as fried and sweet foods.  · Eat well-balanced meals, including foods that are high in iron. Foods that have a lot of iron include leafy green vegetables, meat, liver, eggs, and whole grain breads and cereals.  · Do not try to lose weight until the abnormal bleeding has stopped and your blood iron level is back to normal. If you need to lose weight, work with your health care provider to lose weight safely.  · Keep all follow-up visits as told by your health care provider. This is important.  Contact a health care provider if:  · You soak through a pad or tampon every 1 or 2 hours, and this happens every time you have a period.  · You need to use pads and tampons at the same time because you are bleeding so much.  · You have nausea, vomiting, diarrhea, or other problems  related to medicines you are taking.  Get help right away if:  · You soak through more than a pad or tampon in 1 hour.  · You pass clots bigger than 1 inch (2.5 cm) wide.  · You feel short of breath.  · You feel like your heart is beating too fast.  · You feel dizzy or faint.  · You feel very weak or tired.  Summary  · Menorrhagia is a condition in which menstrual periods are heavy or last longer than normal.  · Treatment will depend on the cause of the condition and may include medicines or procedures.  · Take over-the-counter and prescription medicines exactly as told by your health care provider. This includes iron pills.  · Get help right away if you have heavy bleeding that soaks through more than a pad or tampon in 1 hour, you are passing   large clots, or you feel dizzy, faint or short of breath.  This information is not intended to replace advice given to you by your health care provider. Make sure you discuss any questions you have with your health care provider.  Document Released: 01/23/2005 Document Revised: 01/17/2016 Document Reviewed: 01/17/2016  Elsevier Interactive Patient Education © 2019 [employer]

## 2018-03-04 LAB — NUSWAB VAGINITIS PLUS (VG+)
BVAB 2: HIGH Score — AB
CANDIDA ALBICANS, NAA: NEGATIVE
CANDIDA GLABRATA, NAA: NEGATIVE
CHLAMYDIA TRACHOMATIS, NAA: NEGATIVE
MEGASPHAERA 1: HIGH {score} — AB
NEISSERIA GONORRHOEAE, NAA: NEGATIVE
Trich vag by NAA: NEGATIVE

## 2018-03-05 ENCOUNTER — Telehealth: Payer: Self-pay | Admitting: Adult Health

## 2018-03-05 MED ORDER — METRONIDAZOLE 500 MG PO TABS: 500.0000 mg | ORAL_TABLET | Freq: Two times a day (BID) | ORAL | 0 refills | Status: AC

## 2018-03-05 NOTE — Telephone Encounter (Signed)
Left message that Nuswab +BV sent rx for flagyl to [facility], no alcohol or sex.

## 2018-03-11 ENCOUNTER — Other Ambulatory Visit: Payer: 59

## 2018-03-12 ENCOUNTER — Ambulatory Visit (INDEPENDENT_AMBULATORY_CARE_PROVIDER_SITE_OTHER): Payer: 59

## 2018-03-12 DIAGNOSIS — N852 Hypertrophy of uterus: Secondary | ICD-10-CM

## 2018-03-12 DIAGNOSIS — R140 Abdominal distension (gaseous): Secondary | ICD-10-CM | POA: Diagnosis not present

## 2018-03-12 DIAGNOSIS — N921 Excessive and frequent menstruation with irregular cycle: Secondary | ICD-10-CM | POA: Diagnosis not present

## 2018-03-12 NOTE — Progress Notes (Addendum)
PELVIC US TA/TV:homogeneous retroverted uterus,wnl,EEC 10 mm,normal ovaries bilat,no pain during ultrasound,small amount of simple cul de sac fluid, ovaries appear mobile

## 2018-03-14 ENCOUNTER — Telehealth: Payer: Self-pay | Admitting: Adult Health

## 2018-03-14 NOTE — Telephone Encounter (Signed)
Left message that US was normal, but if wants to try IUD, OCs or ablation, let me know, you can see on Mychart

## 2018-07-12 ENCOUNTER — Telehealth: Payer: 59 | Admitting: Family

## 2018-07-12 DIAGNOSIS — H539 Unspecified visual disturbance: Secondary | ICD-10-CM

## 2018-07-12 DIAGNOSIS — H5713 Ocular pain, bilateral: Secondary | ICD-10-CM

## 2018-07-12 NOTE — Progress Notes (Signed)
Based on what you shared with me, I feel your condition warrants further evaluation and I recommend that you be seen for a face to face office visit.  Given your symptoms of  severe eye pain and vision changes you need to be seen face to face.    NOTE: If you entered your credit card information for this eVisit, you will not be charged. You may see a "hold" on your card for the $35 but that hold will drop off and you will not have a charge processed.  If you are having a true medical emergency please call 911.  If you need an urgent face to face visit, [facility] has four urgent care centers for your convenience.    PLEASE NOTE: THE [facility] LOCATIONS AND URGENT CARE CLINICS DO NOT HAVE THE TESTING FOR CORONAVIRUS COVID19 AVAILABLE.  IF YOU FEEL YOU NEED THIS TEST YOU MUST GO TO A TRIAGE LOCATION AT ONE OF THE HOSPITAL EMERGENCY DEPARTMENTS   [email or web address] to reserve your spot online an avoid wait times  [facility] [address] [place], [place] [address] Modified hours of operation: Monday-Friday, 12 PM to 6 PM  Saturday & Sunday 10 AM to 4 PM *Across the street from [employer]  [facility] (New Address!) [address] [place], [place] [address] *Just off [employer], across the road from [place]* Modified hours of operation: Monday-Friday, 12 PM to 6 PM  Closed Saturday & Sunday  [facility]'s modified hours of operation will be in effect from May 1 until May 31   The following sites will take your insurance:  . [facility]  [phone] Get Driving Directions Find a Provider at this Location  [address] [place], [place] [address] . 10 am to 8 pm Monday-Friday . 12 pm to 8 pm Saturday-Sunday   . [facility] at [facility]  [phone] Get Driving Directions Find a Provider at this Location  [address], [address] [place], [place] [address] .  8 am to 8 pm Monday-Friday . 9 am to 6 pm Saturday . 11 am to 6 pm Sunday   . [facility] at [facility]  [phone] Get Driving Directions  [address] [place], [place] [address] . 8 am to 8 pm Monday-Friday . 8 am to 4 pm Saturday-Sunday   Your e-visit answers were reviewed by a board certified advanced clinical practitioner to complete your personal care plan.  Thank you for using e-Visits.

## 2018-12-04 DIAGNOSIS — Z23 Encounter for immunization: Secondary | ICD-10-CM | POA: Diagnosis not present
